# Patient Record
Sex: Female | Born: 1970
Health system: Southern US, Community
[De-identification: ages and names within clinical notes are randomized; demographics above are authoritative.]

## PROBLEM LIST (undated history)

## (undated) DIAGNOSIS — J9383 Other pneumothorax: Secondary | ICD-10-CM

## (undated) DIAGNOSIS — D239 Other benign neoplasm of skin, unspecified: Secondary | ICD-10-CM

## (undated) HISTORY — PX: NO PAST SURGERIES: SHX2092

## (undated) HISTORY — DX: Other pneumothorax: J93.83

## (undated) HISTORY — DX: Other benign neoplasm of skin, unspecified: D23.9

---

## 2000-10-22 ENCOUNTER — Other Ambulatory Visit: Admission: RE | Admit: 2000-10-22 | Discharge: 2000-10-22 | Payer: Self-pay | Admitting: Obstetrics and Gynecology

## 2001-06-04 ENCOUNTER — Ambulatory Visit (HOSPITAL_COMMUNITY): Admission: RE | Admit: 2001-06-04 | Discharge: 2001-06-04 | Payer: Self-pay | Admitting: Obstetrics and Gynecology

## 2001-06-04 ENCOUNTER — Encounter (INDEPENDENT_AMBULATORY_CARE_PROVIDER_SITE_OTHER): Payer: Self-pay

## 2001-11-11 ENCOUNTER — Other Ambulatory Visit: Admission: RE | Admit: 2001-11-11 | Discharge: 2001-11-11 | Payer: Self-pay | Admitting: Obstetrics and Gynecology

## 2002-06-04 ENCOUNTER — Inpatient Hospital Stay (HOSPITAL_COMMUNITY): Admission: AD | Admit: 2002-06-04 | Discharge: 2002-06-04 | Payer: Self-pay | Admitting: Obstetrics and Gynecology

## 2002-06-05 ENCOUNTER — Inpatient Hospital Stay (HOSPITAL_COMMUNITY): Admission: AD | Admit: 2002-06-05 | Discharge: 2002-06-07 | Payer: Self-pay | Admitting: Obstetrics and Gynecology

## 2002-07-12 ENCOUNTER — Other Ambulatory Visit: Admission: RE | Admit: 2002-07-12 | Discharge: 2002-07-12 | Payer: Self-pay | Admitting: Obstetrics and Gynecology

## 2003-07-14 ENCOUNTER — Other Ambulatory Visit: Admission: RE | Admit: 2003-07-14 | Discharge: 2003-07-14 | Payer: Self-pay | Admitting: Obstetrics and Gynecology

## 2004-10-11 ENCOUNTER — Other Ambulatory Visit: Admission: RE | Admit: 2004-10-11 | Discharge: 2004-10-11 | Payer: Self-pay | Admitting: Obstetrics and Gynecology

## 2005-04-04 ENCOUNTER — Emergency Department (HOSPITAL_COMMUNITY): Admission: EM | Admit: 2005-04-04 | Discharge: 2005-04-04 | Payer: Self-pay | Admitting: Family Medicine

## 2005-06-14 ENCOUNTER — Emergency Department (HOSPITAL_COMMUNITY): Admission: EM | Admit: 2005-06-14 | Discharge: 2005-06-14 | Payer: Self-pay | Admitting: Family Medicine

## 2007-01-14 ENCOUNTER — Emergency Department (HOSPITAL_COMMUNITY): Admission: EM | Admit: 2007-01-14 | Discharge: 2007-01-14 | Payer: Self-pay | Admitting: Emergency Medicine

## 2008-01-13 ENCOUNTER — Ambulatory Visit: Payer: Self-pay | Admitting: Internal Medicine

## 2008-01-13 LAB — CONVERTED CEMR LAB
Albumin: 4.1 g/dL (ref 3.5–5.2)
Bacteria, UA: NEGATIVE
Bilirubin, Direct: 0.2 mg/dL (ref 0.0–0.3)
CO2: 28 meq/L (ref 19–32)
Chloride: 104 meq/L (ref 96–112)
Cholesterol: 140 mg/dL (ref 0–200)
Crystals: NEGATIVE
Eosinophils Absolute: 0.1 10*3/uL (ref 0.0–0.7)
GFR calc Af Amer: 105 mL/min
GFR calc non Af Amer: 87 mL/min
HDL: 47.6 mg/dL (ref >39.0)
Ketones, ur: NEGATIVE mg/dL
LDL Cholesterol: 85 mg/dL (ref 0–99)
Lymphocytes Relative: 27.7 % (ref 12.0–46.0)
Monocytes Absolute: 0.3 10*3/uL (ref 0.1–1.0)
Neutrophils Relative %: 62.8 % (ref 43.0–77.0)
Platelets: 178 10*3/uL (ref 150–400)
Potassium: 4.1 meq/L (ref 3.5–5.1)
Sodium: 139 meq/L (ref 135–145)
Specific Gravity, Urine: 1.025 (ref 1.000–1.03)
TSH: 0.68 microintl units/mL (ref 0.35–5.50)
Total CHOL/HDL Ratio: 2.9
Total Protein, Urine: NEGATIVE mg/dL
Triglycerides: 36 mg/dL (ref 0–149)
VLDL: 7 mg/dL (ref 0–40)
WBC: 4.4 10*3/uL — ABNORMAL LOW (ref 4.5–10.5)
pH: 5 (ref 5.0–8.0)

## 2008-01-17 ENCOUNTER — Ambulatory Visit: Payer: Self-pay | Admitting: Internal Medicine

## 2008-12-15 ENCOUNTER — Ambulatory Visit: Payer: Self-pay | Admitting: Internal Medicine

## 2008-12-15 DIAGNOSIS — R300 Dysuria: Secondary | ICD-10-CM | POA: Insufficient documentation

## 2008-12-15 DIAGNOSIS — N390 Urinary tract infection, site not specified: Secondary | ICD-10-CM | POA: Insufficient documentation

## 2008-12-15 LAB — CONVERTED CEMR LAB
Bilirubin Urine: NEGATIVE
Protein, U semiquant: NEGATIVE

## 2008-12-29 ENCOUNTER — Ambulatory Visit: Payer: Self-pay | Admitting: Internal Medicine

## 2009-05-07 ENCOUNTER — Ambulatory Visit: Payer: Self-pay | Admitting: Family Medicine

## 2009-05-09 ENCOUNTER — Telehealth: Payer: Self-pay | Admitting: Family Medicine

## 2010-03-21 ENCOUNTER — Ambulatory Visit
Admission: RE | Admit: 2010-03-21 | Discharge: 2010-03-21 | Payer: Self-pay | Source: Home / Self Care | Attending: Family Medicine | Admitting: Family Medicine

## 2010-03-21 DIAGNOSIS — G43109 Migraine with aura, not intractable, without status migrainosus: Secondary | ICD-10-CM | POA: Insufficient documentation

## 2010-03-29 ENCOUNTER — Ambulatory Visit
Admission: RE | Admit: 2010-03-29 | Discharge: 2010-03-29 | Payer: Self-pay | Source: Home / Self Care | Attending: Family Medicine | Admitting: Family Medicine

## 2010-03-29 ENCOUNTER — Other Ambulatory Visit: Payer: Self-pay | Admitting: Family Medicine

## 2010-04-02 ENCOUNTER — Telehealth: Payer: Self-pay | Admitting: Family Medicine

## 2010-04-02 DIAGNOSIS — I781 Nevus, non-neoplastic: Secondary | ICD-10-CM | POA: Insufficient documentation

## 2010-04-02 NOTE — Progress Notes (Signed)
Summary: return call  Phone Note Call from Patient Call back at Home Phone [phone]   Caller: Patient Call For: Roderick Pee MD Summary of Call: calling back to let you know that yes, she has had blood in her urine in the past without infection. Initial call taken by: Raechel Ache, RN,  May 09, 2009 1:31 PM  Follow-up for Phone Call        she's had hematuria ,,,,.  First noticed after her 38-year-old child was born....Marland KitchenMarland KitchenI recommend she have a urologic evaluation.  She is disinclined to do that once again, a urine specimen to be sure it's persistent.  Advised to return one week after.  Menses for UA Follow-up by: Roderick Pee MD,  May 10, 2009 3:19 PM

## 2010-04-02 NOTE — Assessment & Plan Note (Signed)
Summary: NEW PT EST (TXFR FROM [facility]) // RS pt rsc/njr   Vital Signs:  Patient profile:   40 year old female Menstrual status:  regular LMP:     05/06/2009 Height:      73 inches Weight:      164 pounds Temp:     98.5 degrees F oral BP sitting:   112 / 78  (left arm) Cuff size:   regular  Vitals Entered By: Kern Reap CMA Duncan Dull) (May 07, 2009 2:37 PM)  Reason for Visit establish care   Primary Care Provider:  JOnes   History of Present Illness: Brittany Waters is a delightful 40 year old, married female, nonsmoker G1, P1....... husband had a vasectomy.... who works  for major benefits, company comes in today for general physical examination,  She's always been in excellent, health.  She's had no chronic health problems.  She had pneumothorax x 2.  They required chest tubes.  Subsequent had surgery.  No recurrences.  Childbirth  x 1, allergic reaction to sulfa, but not sure what kind of reaction .Marland Kitchen... ask her mom.  Review of systems negative, except she's had a history of asymptomatic hematuria and an occasional UTI.  She continues to exercise on a regular basis.  She is a runner.  She is married, has one child......4-year-old daughter  as noted above.  Family history see data last tetanus booster.2010.  She had a skin lesion  removed from her left arm by Dr. gross.  After the initial removal she had a   reexcision.  They told her it was bordering on melanoma most likely a dysplastic type nevus  friend of Milana Huntsman  Allergies: 1)  ! Sulfa  Past History:  Past medical, surgical, family and social histories (including risk factors) reviewed, and no changes noted (except as noted below).  Past Medical History: Spontaneous Pneumothorax X2, stapled on the left upper lobe dysplastic nevus of the left arm by derm.  - re-incission x 1 year recommended  Past Surgical History: Reviewed history from 01/17/2008 and no changes required. Denies surgical history  Family History: Reviewed  history from 01/17/2008 and no changes required. Family History of Alcoholism/Addiction Family History High cholesterol Family History Hypertension  Social History: Reviewed history from 12/15/2008 and no changes required. 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 College at [employer]..Masters degree in social work  Review of Systems      See HPI  Physical Exam  General:  Well-developed,well-nourished,in no acute distress; alert,appropriate and cooperative throughout examination Head:  Normocephalic and atraumatic without obvious abnormalities. No apparent alopecia or balding. Eyes:  No corneal or conjunctival inflammation noted. EOMI. Perrla. Funduscopic exam benign, without hemorrhages, exudates or papilledema. Vision grossly normal. Ears:  External ear exam shows no significant lesions or deformities.  Otoscopic examination reveals clear canals, tympanic membranes are intact bilaterally without bulging, retraction, inflammation or discharge. Hearing is grossly normal bilaterally. Nose:  External nasal examination shows no deformity or inflammation. Nasal mucosa are pink and moist without lesions or exudates. Mouth:  Oral mucosa and oropharynx without lesions or exudates.  Teeth in good repair. Neck:  No deformities, masses, or tenderness noted. Chest Wall:  No deformities, masses, or tenderness noted. Breasts:  No mass, nodules, thickening, tenderness, bulging, retraction, inflamation, nipple discharge or skin changes noted.   Lungs:  Normal respiratory effort, chest expands symmetrically. Lungs are clear to auscultation, no crackles or wheezes. Heart:  Normal rate and  regular rhythm. S1 and S2 normal without gallop, murmur, click, rub or other extra sounds. Abdomen:  Bowel sounds positive,abdomen soft and non-tender without masses, organomegaly or hernias noted. Msk:  No  deformity or scoliosis noted of thoracic or lumbar spine.   Pulses:  R and L carotid,radial,femoral,dorsalis pedis and posterior tibial pulses are full and equal bilaterally Extremities:  No clubbing, cyanosis, edema, or deformity noted with normal full range of motion of all joints.   Neurologic:  No cranial nerve deficits noted. Station and gait are normal. Plantar reflexes are down-going bilaterally. DTRs are symmetrical throughout. Sensory, motor and coordinative functions appear intact. Skin:  total body skin exam normal.  Scar, left arm from previous removal of a lesion Cervical Nodes:  No lymphadenopathy noted Axillary Nodes:  No palpable lymphadenopathy Inguinal Nodes:  No significant adenopathy Psych:  Cognition and judgment appear intact. Alert and cooperative with normal attention span and concentration. No apparent delusions, illusions, hallucinations   Impression & Recommendations:  Problem # 1:  ROUTINE GENERAL MEDICAL EXAM@[email or web address]  CARE FACL (ICD-V70.0) Assessment New  Patient Instructions: 1)  SPF sunscreen, is 50+ and checked your scan, monthly when you do your  monthly breast exam 2)  Please schedule a follow-up appointment in 1 year.

## 2010-04-04 NOTE — Assessment & Plan Note (Signed)
Summary: sudden onset of vision loss/headache and feeling ill all over/dm   Vital Signs:  Patient profile:   40 year old female Menstrual status:  regular Height:      73 inches Weight:      170 pounds BMI:     22.51 Temp:     97.5 degrees F oral BP sitting:   102 / 72  (left arm) Cuff size:   regular  Vitals Entered By: Kern Reap CMA Duncan Dull) (March 21, 2010 3:30 PM) CC: blurr vision, not feeling well, dull headache   Primary Care Provider:  JOnes  CC:  blurr vision, not feeling well, and dull headache.  History of Present Illness: Brittany Waters is a 40 year old, married female, nonsmoker, who comes in today for evaluation of blurred vision.  Since she was a teenager.  She has a "tension headache once or twice a month"..... she describes the headaches as they usually occur in the morning, when she wakes up.  She takes the Motrin, and they go away.  Sometimes it occur before her period.  Today, at 12 noon at work.  She had the gradual onset of blurred vision in both eyes.  It lasted about 30 to 45 minutes and went away.  She now has a dull headache.  Review of systems negative.  Next period due in 3 days.  No birth control.  Her husband had a vasectomy  She's also had a history of dysplastic nevus and we will also do a complete skin exam  Allergies: 1)  ! Sulfa  Past History:  Past medical, surgical, family and social histories (including risk factors) reviewed for relevance to current acute and chronic problems.  Past Medical History: Reviewed history from 05/07/2009 and no changes required. Spontaneous Pneumothorax X2, stapled on the left upper lobe dysplastic nevus of the left arm by derm.  - re-incission x 1 year recommended  Past Surgical History: Reviewed history from 01/17/2008 and no changes required. Denies surgical history  Family History: Reviewed history from 01/17/2008 and no changes required. Family History of Alcoholism/Addiction Family History High  cholesterol Family History Hypertension  Social History: Reviewed history from 05/07/2009 and no changes required. 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 College at [employer]..Masters degree in social work  Review of Systems      See HPI  Physical Exam  General:  Well-developed,well-nourished,in no acute distress; alert,appropriate and cooperative throughout examination Head:  Normocephalic and atraumatic without obvious abnormalities. No apparent alopecia or balding. Eyes:  No corneal or conjunctival inflammation noted. EOMI. Perrla. Funduscopic exam benign, without hemorrhages, exudates or papilledema. Vision grossly normal. Ears:  External ear exam shows no significant lesions or deformities.  Otoscopic examination reveals clear canals, tympanic membranes are intact bilaterally without bulging, retraction, inflammation or discharge. Hearing is grossly normal bilaterally. Nose:  External nasal examination shows no deformity or inflammation. Nasal mucosa are pink and moist without lesions or exudates. Mouth:  Oral mucosa and oropharynx without lesions or exudates.  Teeth in good repair. Neurologic:  No cranial nerve deficits noted. Station and gait are normal. Plantar reflexes are down-going bilaterally. DTRs are symmetrical throughout. Sensory, motor and coordinative functions appear intact. Skin:  total body skin exam normal except for a lesion on her back, right scapula that has two pigment senate ground with central dark advised to return next week for removal   Problems:  Medical Problems Added: 1)  Dx of Migraine With Aura  (  ICD-346.00)  Impression & Recommendations:  Problem # 1:  MIGRAINE WITH AURA (ICD-346.00) Assessment New  Her updated medication list for this problem includes:    Zomig Zmt 2.5 Mg Tbdp (Zolmitriptan) .Marland Kitchen... 1 sl stat at onset of  migraine  Complete Medication List: 1)  Zomig Zmt 2.5 Mg Tbdp (Zolmitriptan) .Marland Kitchen.. 1 sl stat at onset of migraine  Patient Instructions: 1)  continue Motrin 400 mg 3 times a day p.r.n. for migraines, and/or Zomig one sublingual stat at the onset of a headache. 2)  Return p.r.n. Prescriptions: ZOMIG ZMT 2.5 MG TBDP (ZOLMITRIPTAN) 1 SL stat At onset of migraine  #6 x 11   Entered and Authorized by:   Roderick Pee MD   Signed by:   Roderick Pee MD on 03/21/2010   Method used:   Print then Give to Patient   RxID:   [id number]    Orders Added: 1)  Est. Patient Level III [69629]

## 2010-04-10 NOTE — Progress Notes (Signed)
  Phone Note Outgoing Call   Summary of Call: I called Cerise to explain to her what dysplastic nevi are.  We discussed skin exam monthly at home in new exam here.  Sunscreens etc. Initial call taken by: Roderick Pee MD,  April 02, 2010 6:11 PM

## 2010-04-10 NOTE — Miscellaneous (Signed)
Summary: Consent ot office procedure  Consent ot office procedure   Imported By: Maryln Gottron 04/05/2010 12:28:43  _____________________________________________________________________  External Attachment:    Type:   Image     Comment:   External Document

## 2010-04-10 NOTE — Assessment & Plan Note (Signed)
Summary: mole removal//ccm   Procedure Note Last Tetanus: Tdap (12/15/2008)  Mole Biopsy/Removal: Indication: rule out cancer Consent signed: yes  Procedure # 1: elliptical incision with 2 mm margin    Size (in cm): 1.5 x 1.0    Region: posterior    Location: back-mid-right    Instrument used: #15 blade    Anesthesia: 1% lidocaine w/epinephrine    Closure: cautery  Procedure # 2: elliptical incision with 2 mm margin    Size (in cm): 1.0 x 1.0    Region: posterior    Location: back-upper-right    Instrument used: #15 blade    Anesthesia: 1% lidocaine w/epinephrine    Closure: cautery    Primary Care Provider:  JOnes   History of Present Illness: Brittany Waters is a 40 year old, married female, who has a history of white skin light.  Eyes, and previous mole removal, which was dysplastic and comes in today for removal of 2 moles  Allergies: 1)  ! Sulfa   Complete Medication List: 1)  Zomig Zmt 2.5 Mg Tbdp (Zolmitriptan) .Marland Kitchen.. 1 sl stat at onset of migraine  Other Orders: Shave Skin Lesion 1.1-2.0 cm/trunk/arm/leg (09811)   Orders Added: 1)  Shave Skin Lesion 1.1-2.0 cm/trunk/arm/leg [[id number]]

## 2010-07-19 NOTE — Op Note (Signed)
[facility]  Patient:    Brittany Waters, Brittany Waters Visit Number: [id number] MRN: [id number]          Service Type: DSU Location: [facility] Attending Physician:  Rhina Brackett Dictated by:   Duke Salvia. Marcelle Overlie, M.D. Proc. Date: 06/04/01 Admit Date:  06/04/2001 Discharge Date: 06/04/2001                             Operative Report  PREOPERATIVE DIAGNOSIS:       Missed AB.  POSTOPERATIVE DIAGNOSIS:      Missed AB.  PROCEDURE:                    D&E.  SURGEON:                      Duke Salvia. Marcelle Overlie, M.D.  ANESTHESIA:                   Sedation with paracervical block.  PROCEDURE & FINDINGS:                     The patient taken to operating room and after adequate level of sedation was obtained with the patients legs in stirrups, the perineum and vagina were prepped and draped with Betadine.  The bladder was drained.  Examination under anesthesia was carried out and the uterus was 9-10 week size, mid position, adnexa negative.  Speculum was positioned.  Paracervical block was created by infiltrating at 3 oclock and 9 oclock submucosally. Then 5-7 cc of 1% Xylocaine on either side after aspiration.  The cervix was grasped with a tenaculum, sounded to 10 cm, progressively dilated to a #29 Pratt.  A #8 curved suction curet was then used to perform suction curettage. A moderate amount of tissue was noted, with no further tissue to be removed. A small blunt curet was used to explore the cavity, revealing it to be clean. There was minimal bleeding.  She tolerated this well.  Went to recovery room in good condition. Dictated by:   Duke Salvia. Marcelle Overlie, M.D. Attending Physician:  Rhina Brackett DD:  06/04/01 TD:  06/05/01 Job: [id number]

## 2010-07-19 NOTE — H&P (Signed)
[facility]  Patient:    Brittany, Waters Visit Number: [id number] MRN: [id number]          Service Type: DSU Location: [facility] Attending Physician:  Rhina Brackett Dictated by:   Duke Salvia. Marcelle Overlie, M.D. Admit Date:  06/04/2001 Discharge Date: 06/04/2001                           History and Physical  CHIEF COMPLAINT:              Missed AB.  HISTORY OF PRESENT ILLNESS:   A 40 year old G1 P0 presented to the office yesterday for a new OB appointment.  Ultrasound confirmed a missed AB.  Fetal pole was noted without FHR.  Presents now for D&E.  This procedure including risks of bleeding, infection, other complications that may require additional surgery reviewed with her also.  PAST MEDICAL HISTORY:  ALLERGIES:                    SULFA.  OPERATIONS:                   Pneumothorax with a chest tube placement in January 2002; otherwise negative.  PHYSICAL EXAMINATION:  VITAL SIGNS:                  Temperature 98.2, blood pressure 100/70.  HEENT:                        Unremarkable.  NECK:                         Supple without masses.  LUNGS:                        Clear.  CARDIOVASCULAR:               Regular in rate and rhythm without murmurs, rubs, or gallops noted.  BREASTS:                      Without masses.  ABDOMEN:                      Soft, flat, nontender.  PELVIC:                       Normal external genitalia, vagina and cervix clear.  Uterus eight weeks size, midposition.  Adnexa negative.  IMPRESSION:                   Eight-week intrauterine pregnancy, missed abortion.  PLAN:                         D&E.  Discussed as above.Dictated by:   Duke Salvia. Marcelle Overlie, M.D. Attending Physician:  Rhina Brackett DD:  06/03/01 TD:  06/03/01 Job: [id number]

## 2010-09-16 ENCOUNTER — Encounter: Payer: Self-pay | Admitting: Family Medicine

## 2010-09-16 ENCOUNTER — Ambulatory Visit (INDEPENDENT_AMBULATORY_CARE_PROVIDER_SITE_OTHER): Payer: Managed Care, Other (non HMO) | Admitting: Family Medicine

## 2010-09-16 VITALS — BP 102/70 | Temp 98.5°F | Wt 168.0 lb

## 2010-09-16 DIAGNOSIS — I781 Nevus, non-neoplastic: Secondary | ICD-10-CM

## 2010-09-16 DIAGNOSIS — L500 Allergic urticaria: Secondary | ICD-10-CM

## 2010-09-16 DIAGNOSIS — T50905A Adverse effect of unspecified drugs, medicaments and biological substances, initial encounter: Secondary | ICD-10-CM | POA: Insufficient documentation

## 2010-09-16 MED ORDER — PREDNISONE 20 MG PO TABS: ORAL_TABLET | ORAL | Status: DC

## 2010-09-16 NOTE — Patient Instructions (Signed)
Take the prednisone as directed.  In the future avoid all amoxicillin and penicillin derivatives.  Return p.r.n.

## 2010-09-16 NOTE — Progress Notes (Signed)
  Subjective:    Patient ID: Brittany Waters, female    DOB: [birth year], 40 y.o.   MRN: [id number]  HPI Brittany Waters is a 40 year old female, who comes in today for evaluation of acute urticaria.  She states she was in the main and on July the fourth went to a hospital because of facial pain and fever.  Was given amoxicillin with a diagnosis of sinusitis.  This past Sunday morning she woke up with acute urticaria.  She took Benadryl and Zyrtec on Sunday and came in today for evaluation.  She is previously reacted to sulfa and bubble bath.  No airway edema   Review of Systems    General and metabolic review of systems otherwise negative Objective:   Physical Exam    Well-developed well-nourished, female, in no acute distress.  Examination of the skin shows multiple areas of hives.  Because of her previous history of a dysplastic nevus on her back and her left arm, total body skin exam was done.  No abnormal lesions identified  Assessment & Plan:  Acute urticaria.  Plan prednisone burst and taper

## 2011-02-11 ENCOUNTER — Telehealth: Payer: Self-pay | Admitting: Family Medicine

## 2011-02-11 NOTE — Telephone Encounter (Signed)
Open in error

## 2011-02-13 ENCOUNTER — Ambulatory Visit: Payer: Managed Care, Other (non HMO) | Admitting: Family Medicine

## 2011-07-03 ENCOUNTER — Encounter: Payer: Self-pay | Admitting: Family Medicine

## 2011-07-03 ENCOUNTER — Ambulatory Visit (INDEPENDENT_AMBULATORY_CARE_PROVIDER_SITE_OTHER): Payer: BC Managed Care – PPO | Admitting: Family Medicine

## 2011-07-03 ENCOUNTER — Telehealth: Payer: Self-pay | Admitting: Family Medicine

## 2011-07-03 VITALS — BP 102/64

## 2011-07-03 DIAGNOSIS — R300 Dysuria: Secondary | ICD-10-CM

## 2011-07-03 LAB — POCT URINALYSIS DIPSTICK
Bilirubin, UA: NEGATIVE
Glucose, UA: NEGATIVE
Ketones, UA: NEGATIVE
Nitrite, UA: NEGATIVE
Spec Grav, UA: <=1.005
Urobilinogen, UA: 0.2
pH, UA: 6.5

## 2011-07-03 MED ORDER — CIPROFLOXACIN HCL 250 MG PO TABS: 250.0000 mg | ORAL_TABLET | Freq: Every day | ORAL | Status: AC

## 2011-07-03 NOTE — Telephone Encounter (Signed)
Pt called and has a uti. Pt req work in appt with Dr Tawanna Cooler. Pls advise.

## 2011-07-03 NOTE — Progress Notes (Signed)
  Subjective:    Patient ID: Brittany Waters, female    DOB: 1970-09-23, 41 y.o.   MRN: [id number]  HPI Brittany Waters is a 40 year old married female nonsmoker who comes in today with a two-week history of dysuria  She's had a history of asymptomatic hematuria  2 weeks ago he began having dysuria but it was mild yesterday it became severe. No fever chills or back pain. LMP 2 weeks ago normal per birth control her husband's had a vasectomy last UTI a couple years ago   Review of Systems General and urologic review of systems negati    Objective:   Physical Exam  well-developed well-nourished female in no acute distress examination the abdomen was negative urinalysis shows trace white cells and trace blood         Assessment & Plan:   UTI plan because of her allergies to penicillin and sulfa,,,,,,,,,,, will use Cipro 1 day x10 days return when necessary

## 2011-07-03 NOTE — Patient Instructions (Signed)
Take the Cipro 1 tablet daily for 10 days  Drink lots of water  Return sometime this summer for a total body skin exam because of your history of dysplastic nevi

## 2011-07-03 NOTE — Telephone Encounter (Signed)
Okay to work in

## 2011-08-11 ENCOUNTER — Ambulatory Visit (INDEPENDENT_AMBULATORY_CARE_PROVIDER_SITE_OTHER): Payer: BC Managed Care – PPO | Admitting: Family Medicine

## 2011-08-11 ENCOUNTER — Encounter: Payer: Self-pay | Admitting: Family Medicine

## 2011-08-11 ENCOUNTER — Telehealth: Payer: Self-pay | Admitting: Family Medicine

## 2011-08-11 VITALS — BP 100/60 | HR 72 | Temp 98.5°F | Wt 162.0 lb

## 2011-08-11 DIAGNOSIS — R071 Chest pain on breathing: Secondary | ICD-10-CM

## 2011-08-11 NOTE — Telephone Encounter (Signed)
Called pt - Dr.Todd took call and spoke with pt- instructed pt to come to office now to be seen

## 2011-08-11 NOTE — Progress Notes (Signed)
  Subjective:    Patient ID: Brittany Waters, female    DOB: 03-23-1970, 41 y.o.   MRN: [id number]  HPI Brittany Waters  Is a 41 year old female married nonsmoker who comes in today about chest pain  12 years ago she went to emergency room the chest pain x-ray was normal she was called the next day because the x-ray was read by the radiologist showing a pneumothorax. 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 She's done well woke up this morning with some left-sided chest pain she describes as constant dull sometimes sharp Brittany Waters she takes a deep breath twists or moves. She's not been short of breath. No fever no cough   Review of Systems General and pulmonary review of systems otherwise negative    Objective:   Physical Exam Well-developed well-nourished female in no acute distress examination of the lungs is normal       Assessment & Plan:  Chest wall pain question early pneumothorax

## 2011-08-11 NOTE — Telephone Encounter (Signed)
Pulled from Triage vmail. Pt having some lower chest and back pain. Is concerned because she has a Hx of spontaneous pneumothorax. Please call. Thanks.

## 2011-08-11 NOTE — Patient Instructions (Addendum)
Go to the main off this tomorrow for a chest x-ray  Motrin 600 mg 3 times daily with food  Return sometime this summer for an office visit for a thorough skin exam  She's had a history of dysplastic nevi offered to do a thorough skin exam she declined and stated she would come back some other time

## 2011-08-15 ENCOUNTER — Telehealth: Payer: Self-pay | Admitting: Family Medicine

## 2011-08-15 NOTE — Telephone Encounter (Signed)
We received notification that this pt did not get the ordered chest xray done.

## 2011-08-15 NOTE — Telephone Encounter (Signed)
Left message on machine for patient to return our call 

## 2011-12-02 ENCOUNTER — Encounter: Payer: Self-pay | Admitting: Family Medicine

## 2011-12-02 ENCOUNTER — Ambulatory Visit (INDEPENDENT_AMBULATORY_CARE_PROVIDER_SITE_OTHER)
Admission: RE | Admit: 2011-12-02 | Discharge: 2011-12-02 | Disposition: A | Discharge status: Home / Self Care | Payer: BC Managed Care – PPO | Source: Ambulatory Visit | Attending: Family Medicine | Admitting: Family Medicine

## 2011-12-02 ENCOUNTER — Ambulatory Visit (INDEPENDENT_AMBULATORY_CARE_PROVIDER_SITE_OTHER): Payer: BC Managed Care – PPO | Admitting: Family Medicine

## 2011-12-02 VITALS — BP 102/62 | Temp 97.9°F | Wt 162.0 lb

## 2011-12-02 DIAGNOSIS — R071 Chest pain on breathing: Secondary | ICD-10-CM

## 2011-12-02 MED ORDER — DIAZEPAM 2 MG PO TABS: ORAL_TABLET | ORAL | Status: DC

## 2011-12-02 MED ORDER — TRAMADOL HCL 50 MG PO TABS: 50.0000 mg | ORAL_TABLET | Freq: Three times a day (TID) | ORAL | Status: DC | PRN

## 2011-12-02 NOTE — Progress Notes (Signed)
  Subjective:    Patient ID: Brittany Waters, female    DOB: 1970/03/07, 41 y.o.   MRN: [id number]  HPI Brittany Waters is a 41 year old married female nonsmoker who comes in today for evaluation of left thoracic back pain for 8 days  She states 8 days ago on a Sunday she woke up and experienced some low back pain. She points to the left thoracic area just below the scapula. It's been very mild and not really been bothering her all that much until yesterday when after she ran she stretched and yawned and had the sudden onset of severe pain. It's a 3 to a 9 is sharp and it radiates around the left thoracic area. She's had 2 pneumothoraxes in the past. After her last pneumo she had a decortication. No shortness of breath however the pain is consistent with the pain she's had previously with the pneumothorax.    Review of Systems General and pulmonary review of systems otherwise negative    Objective:   Physical Exam Well-developed well-nourished female no acute distress cardiac exam normal spine normal to palpation there is tenderness to palpation in the left lateral thoracic muscles just below the scapula. Lungs are clear to auscultation cardiac exam normal       Assessment & Plan:

## 2011-12-02 NOTE — Patient Instructions (Signed)
Valium and tramadol,,,,,,,,,,,, 1/2-1 tablet of each 3 times daily as needed for back pain  Go to the main office now for a chest x-ray

## 2013-05-24 ENCOUNTER — Encounter: Payer: Self-pay | Admitting: Family Medicine

## 2013-05-24 ENCOUNTER — Ambulatory Visit (INDEPENDENT_AMBULATORY_CARE_PROVIDER_SITE_OTHER): Payer: BC Managed Care – PPO | Admitting: Family Medicine

## 2013-05-24 VITALS — BP 110/78 | Temp 97.7°F | Wt 174.0 lb

## 2013-05-24 DIAGNOSIS — R300 Dysuria: Secondary | ICD-10-CM

## 2013-05-24 DIAGNOSIS — N390 Urinary tract infection, site not specified: Secondary | ICD-10-CM

## 2013-05-24 LAB — POCT URINALYSIS DIPSTICK
Bilirubin, UA: NEGATIVE
GLUCOSE UA: NEGATIVE
KETONES UA: NEGATIVE
NITRITE UA: NEGATIVE
PH UA: 6
PROTEIN UA: NEGATIVE
Spec Grav, UA: 1.015
UROBILINOGEN UA: 0.2

## 2013-05-24 MED ORDER — CIPROFLOXACIN HCL 250 MG PO TABS: 250.0000 mg | ORAL_TABLET | Freq: Two times a day (BID) | ORAL | Status: DC

## 2013-05-24 NOTE — Progress Notes (Signed)
   Subjective:    Patient ID: Brittany Waters, female    DOB: December 09, 1970, 42 y.o.   MRN: [id number]  HPI Brittany Waters is a 43 year old married female nonsmoker who comes in today for evaluation of frequency urinary leakage and dysuria x5 days  She noticed a surgery some inability to hold her water and then a day later some dysuria. No fever LMP 3 weeks ago normal for birth control did hasn't set a vasectomy   Review of Systems    review of systems otherwise negative Objective:   Physical Exam  Well-developed well-nourished female no acute distress vital signs stable he says afebrile examination the abdomen is negative except for some tenderness over the bladder urinalysis shows white cells and bacteria      Assessment & Plan:  Cystitis,,,,,,,, she's allergic to Septra,,,,,, and penicillin drugs,,,,, both give her hives........Marland Kitchen

## 2013-05-24 NOTE — Patient Instructions (Signed)
Drink lots of water  Cipro,,,,,, one twice daily until the bottle is empty

## 2014-04-26 ENCOUNTER — Ambulatory Visit (INDEPENDENT_AMBULATORY_CARE_PROVIDER_SITE_OTHER): Payer: BLUE CROSS/BLUE SHIELD | Admitting: Family Medicine

## 2014-04-26 ENCOUNTER — Encounter: Payer: Self-pay | Admitting: Family Medicine

## 2014-04-26 VITALS — BP 110/70 | Temp 98.4°F | Wt 170.0 lb

## 2014-04-26 DIAGNOSIS — J111 Influenza due to unidentified influenza virus with other respiratory manifestations: Secondary | ICD-10-CM

## 2014-04-26 DIAGNOSIS — J1189 Influenza due to unidentified influenza virus with other manifestations: Secondary | ICD-10-CM

## 2014-04-26 MED ORDER — HYDROCODONE-HOMATROPINE 5-1.5 MG/5ML PO SYRP: 5.0000 mL | ORAL_SOLUTION | Freq: Three times a day (TID) | ORAL | Status: DC | PRN

## 2014-04-26 NOTE — Progress Notes (Signed)
   Subjective:    Patient ID: Brittany Waters, female    DOB: [birth year], 44 y.o.   MRN: [id number]  HPI Brittany Waters is a 44 year old married female nonsmoker who comes in today with a history of fever chills aching all over sore throat and cough for 24 hours  Her husband has similar symptoms and went to a doctor and got Tamiflu   Review of Systems Review of systems otherwise negative    Objective:   Physical Exam  Well-developed well-nourished female no acute distress vital signs stable he is afebrile HEENT were negative neck was supple no adenopathy lungs are clear      Assessment & Plan:  Influenza......... treat symptomatically with liquids Tylenol and Hydromet. Tamiflu not indicated.Marland KitchenMarland KitchenMarland Kitchen

## 2014-04-26 NOTE — Patient Instructions (Signed)
Drink lots of liquids  Tylenol or aspirin for fever chills  Hydromet...Marland KitchenMarland KitchenMarland Kitchen 1/2-1 teaspoon 3 times daily when necessary for cough and cold  Return when necessary

## 2014-04-26 NOTE — Progress Notes (Signed)
Pre visit review using our clinic review tool, if applicable. No additional management support is needed unless otherwise documented below in the visit note. 

## 2014-08-02 ENCOUNTER — Other Ambulatory Visit: Payer: Self-pay | Admitting: Obstetrics and Gynecology

## 2014-08-04 LAB — CYTOLOGY - PAP

## 2014-11-21 ENCOUNTER — Ambulatory Visit (INDEPENDENT_AMBULATORY_CARE_PROVIDER_SITE_OTHER)
Admission: RE | Admit: 2014-11-21 | Discharge: 2014-11-21 | Disposition: A | Discharge status: Home / Self Care | Payer: BLUE CROSS/BLUE SHIELD | Source: Ambulatory Visit | Attending: Family Medicine | Admitting: Family Medicine

## 2014-11-21 ENCOUNTER — Encounter: Payer: Self-pay | Admitting: Family Medicine

## 2014-11-21 ENCOUNTER — Ambulatory Visit (INDEPENDENT_AMBULATORY_CARE_PROVIDER_SITE_OTHER): Payer: BLUE CROSS/BLUE SHIELD | Admitting: Family Medicine

## 2014-11-21 VITALS — BP 120/80 | Temp 98.0°F | Wt 175.0 lb

## 2014-11-21 DIAGNOSIS — S62606A Fracture of unspecified phalanx of right little finger, initial encounter for closed fracture: Secondary | ICD-10-CM

## 2014-11-21 NOTE — Patient Instructions (Signed)
Go to the main office now for your x-rays  I will call you the report as soon as I get

## 2014-11-21 NOTE — Progress Notes (Signed)
Pre visit review using our clinic review tool, if applicable. No additional management support is needed unless otherwise documented below in the visit note. 

## 2014-11-21 NOTE — Progress Notes (Signed)
   Subjective:    Patient ID: Brittany Waters, female    DOB: 1970-05-11, 44 y.o.   MRN: [id number]  HPI Brittany Waters is a 44 year old married female nonsmoker who comes in today for evaluation of pain in her right fifth finger since June  She says she was playing flag football injured that finger felt it would get better but it is not. The proximal joint of that finger is still swollen and tender.   Review of Systems    review of systems otherwise negative Objective:   Physical Exam  Well-developed well-nourished female no acute distress vital signs stable she's afebrile examination of the hand shows the proximal joint on the fifth finger on the right hand is markedly swollen. Ligaments are intact no laxity      Assessment & Plan:  Probable fracture proximal joint right fifth finger...Marland KitchenMarland KitchenMarland Kitchen x-ray follow-up depending on what the x-ray shows

## 2015-01-08 ENCOUNTER — Ambulatory Visit (INDEPENDENT_AMBULATORY_CARE_PROVIDER_SITE_OTHER): Payer: BLUE CROSS/BLUE SHIELD | Admitting: Adult Health

## 2015-01-08 ENCOUNTER — Encounter: Payer: Self-pay | Admitting: Adult Health

## 2015-01-08 VITALS — BP 120/80 | HR 75 | Temp 98.2°F | Wt 175.6 lb

## 2015-01-08 DIAGNOSIS — R300 Dysuria: Secondary | ICD-10-CM | POA: Diagnosis not present

## 2015-01-08 LAB — POCT URINALYSIS DIPSTICK
Bilirubin, UA: NEGATIVE
Blood, UA: NEGATIVE
Glucose, UA: NEGATIVE
KETONES UA: NEGATIVE
Leukocytes, UA: NEGATIVE
Nitrite, UA: NEGATIVE
PH UA: 5.5
PROTEIN UA: NEGATIVE
SPEC GRAV UA: 1.025
Urobilinogen, UA: 0.2

## 2015-01-08 NOTE — Progress Notes (Signed)
Pre visit review using our clinic review tool, if applicable. No additional management support is needed unless otherwise documented below in the visit note. 

## 2015-01-08 NOTE — Progress Notes (Signed)
Subjective:    Patient ID: Brittany Waters, female    DOB: [birth year], 44 y.o.   MRN: [id number]  HPI  44 year old female who presents to the office today for UTI type symptoms. Her complaints started on Wednesday of last week. She endorses that she has burning, frequency and fatigue.   Her last UTI was about a year ago.   She denies any new soaps or detergents. She denies drinking a lot of soda or eating spicy foods.   She denies any any fevers, chills, vaginal pain or discharge.   She has not used any OTC medications Review of Systems  Constitutional: Positive for fatigue. Negative for fever and chills.  Genitourinary: Positive for dysuria, urgency, frequency and difficulty urinating. Negative for hematuria, flank pain, vaginal discharge, vaginal pain and pelvic pain.  Musculoskeletal: Negative for myalgias.  Neurological: Negative.   All other systems reviewed and are negative.  Past Medical History  Diagnosis Date  . Spontaneous pneumothorax     stapled on the left upper lobe  . Dysplastic nevus     of left arm    Social History   Social History  . Marital Status: Married    Spouse Name: N/A  . Number of Children: N/A  . Years of Education: N/A   Occupational History  . Not on file.   Social History Main Topics  . Smoking status: Unknown If Ever Smoked  . Smokeless tobacco: Not on file  . Alcohol Use: No  . Drug Use: No  . Sexual Activity: Not on file   Other Topics Concern  . Not on file   Social History Narrative    No past surgical history on file.  Family History  Problem Relation Age of Onset  . Alcohol abuse      fhx  . Hyperlipidemia      fhx  . Hypertension      fhx    Allergies  Allergen Reactions  . Penicillins Hives  . Sulfonamide Derivatives   . Sulfur Hives    No current outpatient prescriptions on file prior to visit.   No current facility-administered medications on file prior to visit.    BP 120/80 mmHg  Pulse 75   Temp(Src) 98.2 F (36.8 C) (Oral)  Wt 175 lb 9.6 oz (79.652 kg)       Objective:   Physical Exam  Constitutional: She is oriented to person, place, and time. She appears well-developed and well-nourished. No distress.  Cardiovascular: Normal rate, normal heart sounds and intact distal pulses.  Exam reveals no gallop and no friction rub.   No murmur heard. Pulmonary/Chest: Effort normal and breath sounds normal. No respiratory distress. She has no wheezes. She has no rales. She exhibits no tenderness.  Abdominal: Soft. Bowel sounds are normal. She exhibits no distension and no mass. There is no tenderness. There is no rebound and no guarding.  Musculoskeletal:  No CVA tenderness  Neurological: She is alert and oriented to person, place, and time.  Skin: Skin is warm and dry. No rash noted. She is not diaphoretic. No erythema. No pallor.  Psychiatric: She has a normal mood and affect. Her behavior is normal. Judgment and thought content normal.  Vitals reviewed.      Assessment & Plan:  1. Dysuria - Doubtful to be IC, possibly vaginitis. Doubt Hydronephrosis - POCT urinalysis dipstick- Negative for infection - Culture, Urine - She will try OTC Azo and follow up if no improvement in 2-3  days.

## 2015-01-08 NOTE — Patient Instructions (Signed)
Your urinalysis showed no infection. I will send it in for a culture to make sure.   In the meantime, you can take over the counter Azo to help with the bladder spasms and painful urination.   Follow up if no improvement in the next 2-3 days after starting Azo.

## 2015-01-10 LAB — URINE CULTURE: Colony Count: 8000

## 2015-11-23 ENCOUNTER — Encounter: Payer: Self-pay | Admitting: Family Medicine

## 2015-11-23 ENCOUNTER — Ambulatory Visit (INDEPENDENT_AMBULATORY_CARE_PROVIDER_SITE_OTHER): Payer: BLUE CROSS/BLUE SHIELD | Admitting: Family Medicine

## 2015-11-23 VITALS — BP 98/70 | HR 56 | Resp 12 | Ht 73.0 in | Wt 174.1 lb

## 2015-11-23 DIAGNOSIS — R5381 Other malaise: Secondary | ICD-10-CM

## 2015-11-23 DIAGNOSIS — R51 Headache: Secondary | ICD-10-CM | POA: Diagnosis not present

## 2015-11-23 DIAGNOSIS — M542 Cervicalgia: Secondary | ICD-10-CM

## 2015-11-23 DIAGNOSIS — R5383 Other fatigue: Secondary | ICD-10-CM

## 2015-11-23 DIAGNOSIS — R519 Headache, unspecified: Secondary | ICD-10-CM

## 2015-11-23 LAB — BASIC METABOLIC PANEL
BUN: 16 mg/dL (ref 6–23)
CO2: 28 mEq/L (ref 19–32)
Calcium: 9.1 mg/dL (ref 8.4–10.5)
Chloride: 106 mEq/L (ref 96–112)
Creatinine, Ser: 0.88 mg/dL (ref 0.40–1.20)
GFR: 74.51 mL/min (ref >60.00)
Glucose, Bld: 81 mg/dL (ref 70–99)
POTASSIUM: 4.3 meq/L (ref 3.5–5.1)
SODIUM: 140 meq/L (ref 135–145)

## 2015-11-23 LAB — CBC
HCT: 40.2 % (ref 36.0–46.0)
Hemoglobin: 13.6 g/dL (ref 12.0–15.0)
MCHC: 33.8 g/dL (ref 30.0–36.0)
MCV: 90.3 fl (ref 78.0–100.0)
PLATELETS: 170 10*3/uL (ref 150.0–400.0)
RBC: 4.45 Mil/uL (ref 3.87–5.11)
RDW: 13.1 % (ref 11.5–15.5)
WBC: 4.5 10*3/uL (ref 4.0–10.5)

## 2015-11-23 LAB — TSH: TSH: 0.43 u[IU]/mL (ref 0.35–4.50)

## 2015-11-23 MED ORDER — BACLOFEN 10 MG PO TABS: 5.0000 mg | ORAL_TABLET | Freq: Every day | ORAL | 0 refills | Status: AC

## 2015-11-23 NOTE — Progress Notes (Signed)
Pre visit review using our clinic review tool, if applicable. No additional management support is needed unless otherwise documented below in the visit note. 

## 2015-11-23 NOTE — Patient Instructions (Addendum)
  [name] I have seen you today for an acute visit because your primary care provider was not available.   1. Frontal headache   2. Malaise and fatigue  - Basic Metabolic Panel - TSH - CBC - baclofen (LIORESAL) 10 MG tablet; Take 0.5 tablets (5 mg total) by mouth at bedtime.  Dispense: 30 each; Refill: 0  3. Cervicalgia  - baclofen (LIORESAL) 10 MG tablet; Take 0.5 tablets (5 mg total) by mouth at bedtime.  Dispense: 30 each; Refill: 0  Today examination does not suggest a serious illness, tension headache is a possible cause. Baclofen might help   Monitor for signs of worsening symptoms and seek immediate medical attention if any concerning/warning symptom as we discussed.   If symptoms are not resolved in 1-2 weeks you should schedule a follow up appointment with your doctor, before if symptoms get worse.  Please be sure you have an appointment already scheduled with your PCP.

## 2015-11-23 NOTE — Progress Notes (Signed)
HPI:  ACUTE VISIT:  Chief Complaint  Patient presents with  . Headache    started last monday, strong headache for days, no energy.    Brittany Waters is a 45 y.o. female, who is here today complaining of persistent left frontal headache.  She has history of migraines, usually twice per year and related to "hormonal" changes, they area usually associated with nausea and occasional visual aura precedes headache.  On 11/12/2015 she started with left frontal dull headache, intermittently, for the past 3 days he has been constant and one time 3 days ago she felt "strong" headache on same area, sharp, upon getting up rapidly.  Also she is reporting hurting "all over" since headache started as well as fatigue and cervical "soreness." No Hx of recent trauma or unusual physical activity, no limitation of ROM, no joint edema or erythema.  She denies any change in appetite or sleep pattern, she is sleeping well. Denies any history of anxiety or depression.   She denies any associated visual changes, photophobia, nausea, or vomiting.  She has not noted any rash or skin changes, fever, chills, numbness or tingling, or focal deficit. She denies any unusual stress. Pain is 4/10, "annoyed pain."  Yesterday she took Ibuprofen, which helped some but she does not like to take medications. She also took Sudafed yesterday thinking headache could be related to congestion but she has not noted respiratory symptoms. No sick contact or recent travel. She has not been swimming in lakes, public pools, or rivers.  LMP 11/12/15. Husband has vasectomy.   Review of Systems  Constitutional: Positive for fatigue. Negative for appetite change, fever and unexpected weight change.  HENT: Negative for congestion, mouth sores, nosebleeds, sore throat and trouble swallowing.   Eyes: Negative for photophobia, redness and visual disturbance.  Respiratory: Negative for cough, shortness of breath and  wheezing.   Cardiovascular: Negative for chest pain, palpitations and leg swelling.  Gastrointestinal: Negative for abdominal pain, nausea and vomiting.       Negative for changes in bowel habits.  Endocrine: Negative for cold intolerance, heat intolerance, polydipsia, polyphagia and polyuria.  Genitourinary: Negative for decreased urine volume, difficulty urinating, dysuria, hematuria and vaginal bleeding.  Musculoskeletal: Positive for myalgias and neck pain. Negative for arthralgias and gait problem.  Skin: Negative for color change, rash and wound.  Neurological: Positive for headaches. Negative for tremors, seizures, syncope, weakness and numbness.  Hematological: Negative for adenopathy. Does not bruise/bleed easily.  Psychiatric/Behavioral: Negative for confusion and sleep disturbance. The patient is not nervous/anxious.       No current outpatient prescriptions on file prior to visit.   No current facility-administered medications on file prior to visit.      Past Medical History:  Diagnosis Date  . Dysplastic nevus    of left arm  . Spontaneous pneumothorax    stapled on the left upper lobe   Allergies  Allergen Reactions  . Penicillins Hives  . Sulfonamide Derivatives   . Sulfur Hives    Social History   Social History  . Marital status: Married    Spouse name: N/A  . Number of children: N/A  . Years of education: N/A   Social History Main Topics  . Smoking status: Unknown If Ever Smoked  . Smokeless tobacco: None  . Alcohol use No  . Drug use: No  . Sexual activity: Not Asked   Other Topics Concern  . None   Social History Narrative  .  None    Vitals:   11/23/15 1118  BP: 98/70  Pulse: (!) 56  Resp: 12   O2 sat at RA 98%.  Body mass index is 22.97 kg/m.    Physical Exam  Nursing note and vitals reviewed. Constitutional: She is oriented to person, place, and time. She appears well-developed and well-nourished. She does not appear ill.  No distress.  HENT:  Head: Atraumatic.  Mouth/Throat: Oropharynx is clear and moist. Mucous membranes are dry.  Eyes: Conjunctivae and EOM are normal. Pupils are equal, round, and reactive to light.  Fundoscopic exam:      The right eye shows no hemorrhage and no papilledema.       The left eye shows no hemorrhage and no papilledema.  Neck: Normal range of motion. No neck rigidity. No Brudzinski's sign and no Kernig's sign noted. No thyroid mass and no thyromegaly present.  Cardiovascular: Regular rhythm.  Bradycardia present.   No murmur heard. Pulses:      Dorsalis pedis pulses are 2+ on the right side, and 2+ on the left side.  Reporting Hx of bradycardia  Respiratory: Effort normal and breath sounds normal. No respiratory distress.  GI: Soft. She exhibits no mass. There is no hepatomegaly. There is no tenderness.  Musculoskeletal: She exhibits no edema or tenderness.       Cervical back: She exhibits normal range of motion and no tenderness.  Left trapezium muscle spasm. No tenderness upon palpation of paraspinal cervical and thoracic muscles.  Lymphadenopathy:    She has no cervical adenopathy.  Neurological: She is alert and oriented to person, place, and time. She has normal strength. No cranial nerve deficit or sensory deficit. Coordination and gait normal.  Skin: Skin is warm. No rash noted. No erythema.  Psychiatric: She has a normal mood and affect.  Well groomed, good eye contact.      ASSESSMENT AND PLAN:     Ariatna was seen today for headache.  Diagnoses and all orders for this visit:  Malaise and fatigue  We discussed possible causes, because she is reporting this problem as a new onset lab work was recommended today. For now we will monitor and further recommendations would be given according to lab results.  -     Basic Metabolic Panel -     TSH -     CBC -     baclofen (LIORESAL) 10 MG tablet; Take 0.5 tablets (5 mg total) by mouth at  bedtime.  Frontal headache  Explained that headache is a symptom that can be related to multiple illnesses, this particular headache does not seem to be one of her migraines and physical examination does not suggest a concerning etiology. This could be a tension headache, baclofen at the time mayt help, some side effects discussed. We also discussed the option of having brain imaging done but she prefers to hold on this options for now. She was clearly instructed about warning signs. Follow-up in 1-2 weeks if headache is persistent, before if it is worse.   Cervicalgia  Baclofen 5-10 mg at bedtime, ROM exercises, massage, and local heat may help. F/U as needed.  -     baclofen (LIORESAL) 10 MG tablet; Take 0.5 tablets (5 mg total) by mouth at bedtime.    Return if symptoms worsen or fail to improve.     -Ms.Naveen Lorusso Holstine was advised to return or notify a doctor immediately if symptoms worsen or persist or new concerns arise, she voices understanding.  Rusty Glodowski G. [place], MD  [facility]. [place] office.

## 2016-04-23 ENCOUNTER — Telehealth: Payer: Self-pay | Admitting: Family Medicine

## 2016-04-23 DIAGNOSIS — R002 Palpitations: Secondary | ICD-10-CM | POA: Diagnosis not present

## 2016-04-23 NOTE — Telephone Encounter (Signed)
[place] [facility]  Patient Name: Brittany Waters  DOB: [birth year]    Initial Comment Caller says all day her heart beats really hard then she feels like she's out of breath.    Nurse Assessment  Nurse: Harlow Mares, RN, Suanne Marker Date/Time Eilene Ghazi Time): 04/23/2016 4:48:45 PM  Confirm and document reason for call. If symptomatic, describe symptoms. ---Caller says all day her heart beats really hard then she feels like she's out of breath.  Does the patient have any new or worsening symptoms? ---Yes  Will a triage be completed? ---Yes  Related visit to physician within the last 2 weeks? ---N/A  Does the PT have any chronic conditions? (i.e. diabetes, asthma, etc.) ---No  Is the patient pregnant or possibly pregnant? (Ask all females between the ages of 17-55) ---No  Is this a behavioral health or substance abuse call? ---No     Guidelines    Guideline Title Affirmed Question Affirmed Notes  Heart Rate and Heartbeat Questions [1] Skipped or extra beat(s) AND [2] occurs 4 or more times per minute    Final Disposition User   See Physician within 4 Hours (or PCP triage) Harlow Mares, RN, Rhonda    Referrals  GO TO FACILITY UNDECIDED   Disagree/Comply: Comply

## 2016-04-24 NOTE — Telephone Encounter (Signed)
Spoke with pt and she states that she was seen at [facility] last night. They did an EKG and labs. She was advised that EKG was normal, but labs are still pending. Asked pt to please have them send [place] records to keep in her chart. They also advised her they would request a cardiology referral. She is waiting to hear about that. Advised pt if symptoms worsen she needs to seek care immediately. Pt voiced understanding to all. Nothing further needed at this time.

## 2016-04-24 NOTE — Telephone Encounter (Signed)
LMTCB

## 2016-04-29 ENCOUNTER — Telehealth: Payer: Self-pay | Admitting: Cardiology

## 2016-04-29 NOTE — Telephone Encounter (Signed)
close encounter

## 2016-05-06 ENCOUNTER — Encounter: Payer: Self-pay | Admitting: Cardiology

## 2016-05-06 ENCOUNTER — Ambulatory Visit (INDEPENDENT_AMBULATORY_CARE_PROVIDER_SITE_OTHER): Payer: BLUE CROSS/BLUE SHIELD | Admitting: Cardiology

## 2016-05-06 ENCOUNTER — Encounter (INDEPENDENT_AMBULATORY_CARE_PROVIDER_SITE_OTHER): Payer: Self-pay

## 2016-05-06 VITALS — BP 110/78 | HR 66 | Ht 73.0 in | Wt 178.0 lb

## 2016-05-06 DIAGNOSIS — R002 Palpitations: Secondary | ICD-10-CM | POA: Diagnosis not present

## 2016-05-06 NOTE — Telephone Encounter (Signed)
Closed encounter °

## 2016-05-06 NOTE — Progress Notes (Signed)
Electrophysiology Office Note   Date:  05/06/2016   ID:  Brittany Waters, DOB 1971/01/05, MRN [id number]  PCP:  Joycelyn Man, MD  Primary Electrophysiologist:  Constance Haw, MD    Chief Complaint  Patient presents with  . New Patient (Initial Visit)    SOB/Fluttering     History of Present Illness: Brittany Waters is a 46 y.o. female who presents today for electrophysiology evaluation.   She presents today for evaluation of palpitations. Is also associated with shortness of breath. Her palpitations are been going on since February. Palpitations make it difficult for her to catch her breath, but she does not feel short of breath. She has no dizziness, nausea, or vomiting. Her EKG at her urgent care showed sinus rhythm, rate 50. She says that she has episodic palpitations a few times in our. She says they last for seconds at a time. They have been going on for the last 2 weeks.  Today, she denies symptoms of chest pain, shortness of breath, orthopnea, PND, lower extremity edema, claudication, dizziness, presyncope, syncope, bleeding, or neurologic sequela. The patient is tolerating medications without difficulties and is otherwise without complaint today.    Past Medical History:  Diagnosis Date  . Dysplastic nevus    of left arm  . Spontaneous pneumothorax    stapled on the left upper lobe   Past Surgical History:  Procedure Laterality Date  . NO PAST SURGERIES       Current Outpatient Prescriptions  Medication Sig Dispense Refill  . aspirin 81 MG tablet Take 81 mg by mouth daily.     No current facility-administered medications for this visit.     Allergies:   Penicillins; Sulfonamide derivatives; and Sulfur   Social History:  The patient  reports that she has never smoked. She has never used smokeless tobacco. She reports that she does not drink alcohol or use drugs.   Family History:  The patient's family history includes Atrial fibrillation in her father,  maternal grandmother, and mother; Colon cancer in her maternal grandfather; Heart attack in her paternal grandmother; Hypertension in her father; Stroke in her maternal grandmother.    ROS:  Please see the history of present illness.   Otherwise, review of systems is positive for palpitations.   All other systems are reviewed and negative.    PHYSICAL EXAM: VS:  BP 110/78   Pulse 66   Ht 6' 1"  (1.854 m)   Wt 178 lb (80.7 kg)   BMI 23.48 kg/m  , BMI Body mass index is 23.48 kg/m. GEN: Well nourished, well developed, in no acute distress  HEENT: normal  Neck: no JVD, carotid bruits, or masses Cardiac: RRR; no murmurs, rubs, or gallops,no edema  Respiratory:  clear to auscultation bilaterally, normal work of breathing GI: soft, nontender, nondistended, + BS MS: no deformity or atrophy  Skin: warm and dry Neuro:  Strength and sensation are intact Psych: euthymic mood, full affect  EKG:  EKG is ordered today. Personal review of the ekg ordered shows sinus rhythm, rate 66, PVC  Recent Labs: 11/23/2015: BUN 16; Creatinine, Ser 0.88; Hemoglobin 13.6; Platelets 170.0; Potassium 4.3; Sodium 140; TSH 0.43    Lipid Panel     Component Value Date/Time   CHOL 140 01/13/2008 1017   TRIG 36 01/13/2008 1017   HDL 47.6 01/13/2008 1017   CHOLHDL 2.9 CALC 01/13/2008 1017   VLDL 7 01/13/2008 1017   LDLCALC 85 01/13/2008 1017  Wt Readings from Last 3 Encounters:  05/06/16 178 lb (80.7 kg)  11/23/15 174 lb 2 oz (79 kg)  01/08/15 175 lb 9.6 oz (79.7 kg)      Other studies Reviewed: Additional studies/ records that were reviewed today include: Urgent care notes   ASSESSMENT AND PLAN:  1.  Palpitations: Currently the cause for palpitations is unclear. Her EKGs when she was at urgent care showed normal rhythm without any major abnormalities. She has had one PVC on the EKG done today. It is possible that her symptoms are all due to PVCs. Tadeo Besecker order a 48 hour monitor to quantify the  number of PVCs, as well as look for any other sources of arrhythmia.    Current medicines are reviewed at length with the patient today.   The patient does not have concerns regarding her medicines.  The following changes were made today:  none  Labs/ tests ordered today include:  Orders Placed This Encounter  Procedures  . Holter monitor - 48 hour  . EKG 12-Lead     Disposition:   FU with Vyncent Overby 1 month  Signed, Jayveon Convey Meredith Leeds, MD  05/06/2016 2:34 PM     [facility] [place] [address] [place] [address] [phone] (office) [phone] (fax)

## 2016-05-06 NOTE — Patient Instructions (Signed)
Medication Instructions:    Your physician recommends that you continue on your current medications as directed. Please refer to the Current Medication list given to you today.  --- If you need a refill on your cardiac medications before your next appointment, please call your pharmacy. ---  Labwork:  None ordered  Testing/Procedures: Your physician has recommended that you wear a 48 hour holter monitor. Holter monitors are medical devices that record the heart's electrical activity. Doctors most often use these monitors to diagnose arrhythmias. Arrhythmias are problems with the speed or rhythm of the heartbeat. The monitor is a small, portable device. You can wear one while you do your normal daily activities. This is usually used to diagnose what is causing palpitations/syncope (passing out).  Follow-Up:  Your physician recommends that you schedule a follow-up appointment in: 1 month with Dr. Curt Bears.  Thank you for choosing [facility]!!   Trinidad Curet, RN [phone]   Any Other Special Instructions Will Be Listed Below (If Applicable).   Holter Monitoring A Holter monitor is a small device that is used to detect abnormal heart rhythms. It clips to your clothing and is connected by wires to flat, sticky disks (electrodes) that attach to your chest. It is worn continuously for 24-48 hours. Follow these instructions at home:  Wear your Holter monitor at all times, even while exercising and sleeping, for as long as directed by your health care provider.  Make sure that the Holter monitor is safely clipped to your clothing or close to your body as recommended by your health care provider.  Do not get the monitor or wires wet.  Do not put body lotion or moisturizer on your chest.  Keep your skin clean.  Keep a diary of your daily activities, such as walking and doing chores. If you feel that your heartbeat is abnormal or that your heart is fluttering or skipping a  beat:  Record what you are doing when it happens.  Record what time of day the symptoms occur.  Return your Holter monitor as directed by your health care provider.  Keep all follow-up visits as directed by your health care provider. This is important. Get help right away if:  You feel lightheaded or you faint.  You have trouble breathing.  You feel pain in your chest, upper arm, or jaw.  You feel sick to your stomach and your skin is pale, cool, or damp.  You heartbeat feels unusual or abnormal. This information is not intended to replace advice given to you by your health care provider. Make sure you discuss any questions you have with your health care provider. Document Released: 11/16/2003 Document Revised: 07/26/2015 Document Reviewed: 09/26/2013 Elsevier Interactive Patient Education  2017 [employer].

## 2016-05-14 ENCOUNTER — Ambulatory Visit (INDEPENDENT_AMBULATORY_CARE_PROVIDER_SITE_OTHER): Payer: BLUE CROSS/BLUE SHIELD

## 2016-05-14 DIAGNOSIS — R002 Palpitations: Secondary | ICD-10-CM

## 2016-06-10 ENCOUNTER — Encounter (INDEPENDENT_AMBULATORY_CARE_PROVIDER_SITE_OTHER): Payer: Self-pay

## 2016-06-10 ENCOUNTER — Ambulatory Visit (INDEPENDENT_AMBULATORY_CARE_PROVIDER_SITE_OTHER): Payer: BLUE CROSS/BLUE SHIELD | Admitting: Cardiology

## 2016-06-10 ENCOUNTER — Encounter: Payer: Self-pay | Admitting: Cardiology

## 2016-06-10 VITALS — BP 108/82 | HR 54 | Ht 73.0 in | Wt 181.6 lb

## 2016-06-10 DIAGNOSIS — R002 Palpitations: Secondary | ICD-10-CM

## 2016-06-10 NOTE — Patient Instructions (Signed)
Medication Instructions:    Your physician recommends that you continue on your current medications as directed. Please refer to the Current Medication list given to you today.  --- If you need a refill on your cardiac medications before your next appointment, please call your pharmacy. ---  Labwork:  None ordered  Testing/Procedures:  None ordered  Follow-Up:  No follow up is needed at this time with Dr. Curt Bears.  He will see you on an as needed basis.  Thank you for choosing [facility]!!   Trinidad Curet, RN [phone]        r

## 2016-06-10 NOTE — Progress Notes (Signed)
Electrophysiology Office Note   Date:  06/10/2016   ID:  Brittany Waters, DOB 05-Apr-1970, MRN [id number]  PCP:  Brittany Man, MD  Primary Electrophysiologist:  Brittany Haw, MD    Chief Complaint  Patient presents with  . Follow-up    Palpitations     History of Present Illness: Brittany Waters is a 46 y.o. female who presents today for electrophysiology evaluation.   She presents today for evaluation of palpitations. Is also associated with shortness of breath. Her palpitations are been going on since February. She wore a Holter monitor that showed rare PVCs and sinus rhythm. She does continue to have episodic palpitations. There is no relation to caffeine.  Today, she denies symptoms of chest pain, shortness of breath, orthopnea, PND, lower extremity edema, claudication, dizziness, presyncope, syncope, bleeding, or neurologic sequela. The patient is tolerating medications without difficulties and is otherwise without complaint today.    Past Medical History:  Diagnosis Date  . Dysplastic nevus    of left arm  . Spontaneous pneumothorax    stapled on the left upper lobe   Past Surgical History:  Procedure Laterality Date  . NO PAST SURGERIES       No current outpatient prescriptions on file.   No current facility-administered medications for this visit.     Allergies:   Penicillins; Sulfonamide derivatives; and Sulfur   Social History:  The patient  reports that she has never smoked. She has never used smokeless tobacco. She reports that she does not drink alcohol or use drugs.   Family History:  The patient's family history includes Atrial fibrillation in her father, maternal grandmother, and mother; Colon cancer in her maternal grandfather; Heart attack in her paternal grandmother; Hypertension in her father; Stroke in her maternal grandmother.    ROS:  Please see the history of present illness.   Otherwise, review of systems is positive for palpitations.    All other systems are reviewed and negative.    PHYSICAL EXAM: VS:  BP 108/82   Pulse (!) 54   Ht 6' 1"  (1.854 m)   Wt 181 lb 9.6 oz (82.4 kg)   BMI 23.96 kg/m  , BMI Body mass index is 23.96 kg/m. GEN: Well nourished, well developed, in no acute distress  HEENT: normal  Neck: no JVD, carotid bruits, or masses Cardiac: RRR; no murmurs, rubs, or gallops,no edema  Respiratory:  clear to auscultation bilaterally, normal work of breathing GI: soft, nontender, nondistended, + BS MS: no deformity or atrophy  Skin: warm and dry Neuro:  Strength and sensation are intact Psych: euthymic mood, full affect  EKG:  EKG is not ordered today. Personal review of the ekg ordered 05/06/16 shows sinus rhythm, rate 66, PVC  Recent Labs: 11/23/2015: BUN 16; Creatinine, Ser 0.88; Hemoglobin 13.6; Platelets 170.0; Potassium 4.3; Sodium 140; TSH 0.43    Lipid Panel     Component Value Date/Time   CHOL 140 01/13/2008 1017   TRIG 36 01/13/2008 1017   HDL 47.6 01/13/2008 1017   CHOLHDL 2.9 CALC 01/13/2008 1017   VLDL 7 01/13/2008 1017   LDLCALC 85 01/13/2008 1017     Wt Readings from Last 3 Encounters:  06/10/16 181 lb 9.6 oz (82.4 kg)  05/06/16 178 lb (80.7 kg)  11/23/15 174 lb 2 oz (79 kg)      Other studies Reviewed: Additional studies/ records that were reviewed today include: Holter monitor 05/14/16 Minimum HR: 45 BPM at 6:07:44 PM(2) Maximum HR:  164 BPM at 5:28:22 AM(2) Average HR: 71 BPM Sinus rhythm with sinus tachycardia Zero atrial fibrillation 0.4% PVCs, rare APCs   ASSESSMENT AND PLAN:  1.  Palpitations: Wore monitor showing sinus rhythm with sinus tachycardia. Her palpitations seen on her monitor.  Told her that these palpitations did not put her at risk of major abnormality. I have offered her medication management versus watchful waiting. She would like to continue to monitor her palpitations and call us back if they fail to improve or get worse.    Current  medicines are reviewed at length with the patient today.   The patient does not have concerns regarding her medicines.  The following changes were made today:  none  Labs/ tests ordered today include:  No orders of the defined types were placed in this encounter.    Disposition:   FU with Brittany Waters PRN month  Signed, Brittany Ortez Meredith Leeds, MD  06/10/2016 9:50 AM     [facility] [address] [place] [place] [address] [phone] (office) [phone] (fax)

## 2016-09-02 ENCOUNTER — Ambulatory Visit (INDEPENDENT_AMBULATORY_CARE_PROVIDER_SITE_OTHER): Payer: BLUE CROSS/BLUE SHIELD | Admitting: Family Medicine

## 2016-09-02 VITALS — BP 102/78 | HR 59 | Temp 98.0°F | Wt 177.2 lb

## 2016-09-02 DIAGNOSIS — L237 Allergic contact dermatitis due to plants, except food: Secondary | ICD-10-CM | POA: Diagnosis not present

## 2016-09-02 MED ORDER — PREDNISONE 10 MG PO TABS: ORAL_TABLET | ORAL | 0 refills | Status: DC

## 2016-09-02 NOTE — Patient Instructions (Signed)

## 2016-09-02 NOTE — Progress Notes (Signed)
Subjective:     Patient ID: Brittany Waters, female   DOB: 04/12/1970, 46 y.o.   MRN: [id number]  HPI Patient seen as a work in with pruritic vesicular rash on her left neck and forearms. Onset this past weekend. Progressed through the week. Worse with heat. She tried some type of topical and also some oral Benadryl without much improvement. Moderate pruritus.  Past Medical History:  Diagnosis Date  . Dysplastic nevus    of left arm  . Spontaneous pneumothorax    stapled on the left upper lobe   Past Surgical History:  Procedure Laterality Date  . NO PAST SURGERIES      reports that she has never smoked. She has never used smokeless tobacco. She reports that she does not drink alcohol or use drugs. family history includes Atrial fibrillation in her father, maternal grandmother, and mother; Colon cancer in her maternal grandfather; Heart attack in her paternal grandmother; Hypertension in her father; Stroke in her maternal grandmother. Allergies  Allergen Reactions  . Penicillins Hives  . Sulfonamide Derivatives   . Sulfur Hives  '   Review of Systems  Constitutional: Negative for chills and fever.  Skin: Positive for rash.       Objective:   Physical Exam  Constitutional: She appears well-developed and well-nourished.  Cardiovascular: Normal rate and regular rhythm.   Pulmonary/Chest: Effort normal and breath sounds normal. No respiratory distress. She has no wheezes. She has no rales.  Skin: Rash noted.  Patient has vesicular rash scattered on her forearms bilaterally and left side of neck. A couple areas are in linear distribution.       Assessment:     Contact dermatitis    Plan:     -Prednisone taper starting at 60 mg daily -Avoid heat which may exacerbate symptoms  Eulas Post MD [facility]

## 2016-12-25 DIAGNOSIS — Z1231 Encounter for screening mammogram for malignant neoplasm of breast: Secondary | ICD-10-CM | POA: Diagnosis not present

## 2016-12-31 DIAGNOSIS — Z01419 Encounter for gynecological examination (general) (routine) without abnormal findings: Secondary | ICD-10-CM | POA: Diagnosis not present

## 2016-12-31 DIAGNOSIS — N841 Polyp of cervix uteri: Secondary | ICD-10-CM | POA: Diagnosis not present

## 2016-12-31 DIAGNOSIS — Z6823 Body mass index (BMI) 23.0-23.9, adult: Secondary | ICD-10-CM | POA: Diagnosis not present

## 2017-01-05 DIAGNOSIS — L821 Other seborrheic keratosis: Secondary | ICD-10-CM | POA: Diagnosis not present

## 2017-01-05 DIAGNOSIS — D1801 Hemangioma of skin and subcutaneous tissue: Secondary | ICD-10-CM | POA: Diagnosis not present

## 2017-01-05 DIAGNOSIS — L814 Other melanin hyperpigmentation: Secondary | ICD-10-CM | POA: Diagnosis not present

## 2017-01-09 DIAGNOSIS — Z13220 Encounter for screening for lipoid disorders: Secondary | ICD-10-CM | POA: Diagnosis not present

## 2017-01-09 DIAGNOSIS — Z13228 Encounter for screening for other metabolic disorders: Secondary | ICD-10-CM | POA: Diagnosis not present

## 2017-02-13 ENCOUNTER — Encounter: Payer: Self-pay | Admitting: Adult Health

## 2017-02-13 ENCOUNTER — Encounter: Payer: Self-pay | Admitting: Family Medicine

## 2017-02-13 ENCOUNTER — Ambulatory Visit: Payer: BLUE CROSS/BLUE SHIELD | Admitting: Adult Health

## 2017-02-13 VITALS — BP 96/50 | Temp 98.0°F | Wt 178.0 lb

## 2017-02-13 DIAGNOSIS — R1013 Epigastric pain: Secondary | ICD-10-CM | POA: Diagnosis not present

## 2017-02-13 LAB — CBC WITH DIFFERENTIAL/PLATELET
BASOS ABS: 0 10*3/uL (ref 0.0–0.1)
Basophils Relative: 0.6 % (ref 0.0–3.0)
EOS ABS: 0.1 10*3/uL (ref 0.0–0.7)
Eosinophils Relative: 1.5 % (ref 0.0–5.0)
HEMATOCRIT: 40.9 % (ref 36.0–46.0)
HEMOGLOBIN: 13.5 g/dL (ref 12.0–15.0)
LYMPHS PCT: 28.9 % (ref 12.0–46.0)
Lymphs Abs: 1.3 10*3/uL (ref 0.7–4.0)
MCHC: 33 g/dL (ref 30.0–36.0)
MCV: 92.3 fl (ref 78.0–100.0)
Monocytes Absolute: 0.4 10*3/uL (ref 0.1–1.0)
Monocytes Relative: 8.4 % (ref 3.0–12.0)
Neutro Abs: 2.8 10*3/uL (ref 1.4–7.7)
Neutrophils Relative %: 60.6 % (ref 43.0–77.0)
Platelets: 187 10*3/uL (ref 150.0–400.0)
RBC: 4.43 Mil/uL (ref 3.87–5.11)
RDW: 13 % (ref 11.5–15.5)
WBC: 4.6 10*3/uL (ref 4.0–10.5)

## 2017-02-13 LAB — COMPREHENSIVE METABOLIC PANEL
ALBUMIN: 4.3 g/dL (ref 3.5–5.2)
ALT: 12 U/L (ref 0–35)
AST: 13 U/L (ref 0–37)
Alkaline Phosphatase: 45 U/L (ref 39–117)
BILIRUBIN TOTAL: 1.1 mg/dL (ref 0.2–1.2)
BUN: 15 mg/dL (ref 6–23)
CALCIUM: 9.2 mg/dL (ref 8.4–10.5)
CO2: 30 meq/L (ref 19–32)
CREATININE: 0.84 mg/dL (ref 0.40–1.20)
Chloride: 104 mEq/L (ref 96–112)
GFR: 78.18 mL/min (ref >60.00)
Glucose, Bld: 81 mg/dL (ref 70–99)
Potassium: 4 mEq/L (ref 3.5–5.1)
Sodium: 139 mEq/L (ref 135–145)
Total Protein: 6.6 g/dL (ref 6.0–8.3)

## 2017-02-13 NOTE — Progress Notes (Signed)
Subjective:    Patient ID: Brittany Waters, female    DOB: 1970-10-13, 46 y.o.   MRN: [id number]  Abdominal Pain  This is a new problem. The current episode started 1 to 4 weeks ago (2 weeks). The onset quality is gradual. The problem occurs constantly. The problem has been unchanged. The pain is located in the epigastric region and RUQ. The pain is at a severity of 3/10. The quality of the pain is aching. The abdominal pain does not radiate. Associated symptoms include anorexia, belching and nausea. Pertinent negatives include no constipation, diarrhea, dysuria, fever, flatus, headaches, hematuria, melena, vomiting or weight loss. The pain is aggravated by certain positions and eating. The pain is relieved by nothing. She has tried nothing for the symptoms. There is no history of abdominal surgery, gallstones, GERD, irritable bowel syndrome or PUD.      Review of Systems  Constitutional: Positive for appetite change. Negative for diaphoresis, fatigue, fever and weight loss.  HENT: Negative.   Respiratory: Positive for cough (dry ).   Cardiovascular: Negative.   Gastrointestinal: Positive for abdominal pain, anorexia and nausea. Negative for constipation, diarrhea, flatus, melena and vomiting.  Genitourinary: Negative.  Negative for dysuria and hematuria.  Skin: Negative.   Neurological: Negative for headaches.   Past Medical History:  Diagnosis Date  . Dysplastic nevus    of left arm  . Spontaneous pneumothorax    stapled on the left upper lobe    Social History   Socioeconomic History  . Marital status: Married    Spouse name: Not on file  . Number of children: Not on file  . Years of education: Not on file  . Highest education level: Not on file  Social Needs  . Financial resource strain: Not on file  . Food insecurity - worry: Not on file  . Food insecurity - inability: Not on file  . Transportation needs - medical: Not on file  . Transportation needs - non-medical: Not  on file  Occupational History  . Not on file  Tobacco Use  . Smoking status: Never Smoker  . Smokeless tobacco: Never Used  Substance and Sexual Activity  . Alcohol use: No  . Drug use: No  . Sexual activity: Not on file  Other Topics Concern  . Not on file  Social History Narrative  . Not on file    Past Surgical History:  Procedure Laterality Date  . NO PAST SURGERIES      Family History  Problem Relation Age of Onset  . Alcohol abuse Unknown        fhx  . Hyperlipidemia Unknown        fhx  . Hypertension Unknown        fhx  . Atrial fibrillation Mother   . Atrial fibrillation Father   . Hypertension Father   . Atrial fibrillation Maternal Grandmother   . Stroke Maternal Grandmother   . Colon cancer Maternal Grandfather   . Heart attack Paternal Grandmother     Allergies  Allergen Reactions  . Penicillins Hives  . Sulfonamide Derivatives   . Sulfur Hives    No current outpatient medications on file prior to visit.   No current facility-administered medications on file prior to visit.     BP (!) 96/50   Temp 98 F (36.7 C) (Oral)   Wt 178 lb (80.7 kg)   BMI 23.48 kg/m       Objective:   Physical Exam  Constitutional: She  is oriented to person, place, and time. She appears well-developed and well-nourished. No distress.  Cardiovascular: Normal rate, regular rhythm and intact distal pulses. Exam reveals no gallop and no friction rub.  No murmur heard. Pulmonary/Chest: Effort normal and breath sounds normal. No respiratory distress. She has no wheezes. She has no rales. She exhibits no tenderness.  Abdominal: Soft. Normal appearance and bowel sounds are normal. She exhibits no distension and no mass. There is no hepatosplenomegaly, splenomegaly or hepatomegaly. There is tenderness (mild tenderness to epigastric area and RUQ ) in the right upper quadrant and epigastric area. There is no rebound, no guarding and no CVA tenderness. No hernia.    Neurological: She is alert and oriented to person, place, and time.  Skin: Skin is warm and dry. No rash noted. She is not diaphoretic. No erythema. No pallor.  Psychiatric: She has a normal mood and affect. Her behavior is normal. Judgment and thought content normal.  Vitals reviewed.     Assessment & Plan:  1. Epigastric pain - Exam consistent with GERD like symptoms. No concern for bowel obstruction, UTI, or kidney stone. Will get labs to r/o other causes - CBC with Differential/Platelet - CMP - Advised OTC prilosec 20 mg daily for 2-3 weeks and then can stop taking it.  - Follow up in one week if symptoms not resolving or sooner if symptoms become worse - Consider CT scan in the future  Dorothyann Peng, NP

## 2017-02-13 NOTE — Patient Instructions (Addendum)
It was great seeing you again.   Your exam is consistent with acid reflux.   Please take omeprazole 20 mg daily for 2-3 weeks and then stop  If you are not feeling any better in a week, please follow up with me    Heartburn Heartburn is a type of pain or discomfort that can happen in the throat or chest. It is often described as a burning pain. It may also cause a bad taste in the mouth. Heartburn may feel worse when you lie down or bend over. It may be caused by stomach contents that move back up (reflux) into the tube that connects the mouth with the stomach (esophagus). Follow these instructions at home: Take these actions to lessen your discomfort and to help avoid problems. Diet  Follow a diet as told by your doctor. You may need to avoid foods and drinks such as: ? Coffee and tea (with or without caffeine). ? Drinks that contain alcohol. ? Energy drinks and sports drinks. ? Carbonated drinks or sodas. ? Chocolate and cocoa. ? Peppermint and mint flavorings. ? Garlic and onions. ? Horseradish. ? Spicy and acidic foods, such as peppers, chili powder, curry powder, vinegar, hot sauces, and BBQ sauce. ? Citrus fruit juices and citrus fruits, such as oranges, lemons, and limes. ? Tomato-based foods, such as red sauce, chili, salsa, and pizza with red sauce. ? Fried and fatty foods, such as donuts, french fries, potato chips, and high-fat dressings. ? High-fat meats, such as hot dogs, rib eye steak, sausage, ham, and bacon. ? High-fat dairy items, such as whole milk, butter, and cream cheese.  Eat small meals often. Avoid eating large meals.  Avoid drinking large amounts of liquid with your meals.  Avoid eating meals during the 2-3 hours before bedtime.  Avoid lying down right after you eat.  Do not exercise right after you eat. General instructions  Pay attention to any changes in your symptoms.  Take over-the-counter and prescription medicines only as told by your  doctor. Do not take aspirin, ibuprofen, or other NSAIDs unless your doctor says it is okay.  Do not use any tobacco products, including cigarettes, chewing tobacco, and e-cigarettes. If you need help quitting, ask your doctor.  Wear loose clothes. Do not wear anything tight around your waist.  Raise (elevate) the head of your bed about 6 inches (15 cm).  Try to lower your stress. If you need help doing this, ask your doctor.  If you are overweight, lose an amount of weight that is healthy for you. Ask your doctor about a safe weight loss goal.  Keep all follow-up visits as told by your doctor. This is important. Contact a doctor if:  You have new symptoms.  You lose weight and you do not know why it is happening.  You have trouble swallowing, or it hurts to swallow.  You have wheezing or a cough that keeps happening.  Your symptoms do not get better with treatment.  You have heartburn often for more than two weeks. Get help right away if:  You have pain in your arms, neck, jaw, teeth, or back.  You feel sweaty, dizzy, or light-headed.  You have chest pain or shortness of breath.  You throw up (vomit) and your throw up looks like blood or coffee grounds.  Your poop (stool) is bloody or black. This information is not intended to replace advice given to you by your health care provider. Make sure you discuss any questions  you have with your health care provider. Document Released: 10/30/2010 Document Revised: 07/26/2015 Document Reviewed: 06/14/2014 Elsevier Interactive Patient Education  [employer].

## 2017-05-03 DIAGNOSIS — S2020XA Contusion of thorax, unspecified, initial encounter: Secondary | ICD-10-CM | POA: Diagnosis not present

## 2017-07-06 DIAGNOSIS — J0100 Acute maxillary sinusitis, unspecified: Secondary | ICD-10-CM | POA: Diagnosis not present

## 2017-07-13 ENCOUNTER — Ambulatory Visit: Payer: Self-pay | Admitting: *Deleted

## 2017-07-13 ENCOUNTER — Ambulatory Visit: Payer: BLUE CROSS/BLUE SHIELD | Admitting: Family Medicine

## 2017-07-13 ENCOUNTER — Encounter: Payer: Self-pay | Admitting: Family Medicine

## 2017-07-13 VITALS — BP 102/70 | HR 59 | Temp 97.8°F | Wt 182.9 lb

## 2017-07-13 DIAGNOSIS — H6503 Acute serous otitis media, bilateral: Secondary | ICD-10-CM

## 2017-07-13 DIAGNOSIS — J019 Acute sinusitis, unspecified: Secondary | ICD-10-CM

## 2017-07-13 NOTE — Patient Instructions (Signed)

## 2017-07-13 NOTE — Progress Notes (Signed)
  Subjective:     Patient ID: Brittany Waters, female   DOB: 1971/01/29, 47 y.o.   MRN: [id number]  HPI Patient seen with almost 3 week history of sinus congestion and sinus pressure pain. She had increased progressive symptoms involving her frontal and maxillary sinuses. She was seen in urgent care a week ago and placed on doxycycline. She is on day 6 and does feel somewhat better overall. Her major concern is that she's had some ear fullness bilaterally and was scheduled to do a business trip to [place] tomorrow for presentation. She is worried about increased risk to her eardrums with fluid in the ears. She's not had any vertigo. No fever. Overall improved since starting doxycycline  Past Medical History:  Diagnosis Date  . Dysplastic nevus    of left arm  . Spontaneous pneumothorax    stapled on the left upper lobe   Past Surgical History:  Procedure Laterality Date  . NO PAST SURGERIES      reports that she has never smoked. She has never used smokeless tobacco. She reports that she does not drink alcohol or use drugs. family history includes Alcohol abuse in her unknown relative; Atrial fibrillation in her father, maternal grandmother, and mother; Colon cancer in her maternal grandfather; Heart attack in her paternal grandmother; Hyperlipidemia in her unknown relative; Hypertension in her father and unknown relative; Stroke in her maternal grandmother. Allergies  Allergen Reactions  . Penicillins Hives  . Sulfonamide Derivatives   . Sulfur Hives     Review of Systems  Constitutional: Negative for chills and fever.  HENT: Positive for congestion and ear pain. Negative for ear discharge.   Respiratory: Negative for cough and shortness of breath.   Neurological: Negative for dizziness and headaches.       Objective:   Physical Exam  Constitutional: She appears well-developed and well-nourished.  HENT:  Right Ear: External ear normal.  Left Ear: External ear normal.   Mouth/Throat: Oropharynx is clear and moist.  She has evidence for some retraction involving the right eardrum and she has some mild erythema involving the left eardrum with serous effusion bilaterally. Question early supperative changes inferior aspect left eardrum. No perforation  Cardiovascular: Normal rate and regular rhythm.  Pulmonary/Chest: Effort normal and breath sounds normal. She has no wheezes. She has no rales.       Assessment:     Bilateral serous otitis media following recent acute sinusitis. Her sinusitis symptoms are slowly improving on doxycycline    Plan:     -Finish out doxycycline -We explained serous effusions can take sometimes several weeks to resolve. -We also explained that she does have some increased risk of barotrauma with flying and she will try to get out of her trip tomorrow if possible  Eulas Post MD [facility] at [facility]

## 2017-07-13 NOTE — Telephone Encounter (Signed)
Pt called with asking for medical advice regarding taking a flight having nasal congestion. Pt states that she went to the [facility] and was given an antibiotic for her sinuses. But she waited to take the medication. She also stated that they noticed that one of her ears was red.  She is stating now that she has awaken with a headache in the back of her head every day since last Wed or Thurs when she went to the [facility]. She has fullness in both ears and facial pressure.  Denies having a fever now. She is just afraid to fly with fullness that she is experiencing now Appointment made for today per protocol. Will route to flow at [facility] [place].  Reason for Disposition . [1] Taking antibiotic > 72 hours (3 days) AND [2] sinus pain not improved  Answer Assessment - Initial Assessment Questions 1. LOCATION: "Where does it hurt?"      Head and face 2. ONSET: "When did the sinus pain start?"  (e.g., hours, days)      pressure in face 3. SEVERITY: "How bad is the pain?"   (Scale 1-10; mild, moderate or severe)   - MILD (1-3): doesn't interfere with normal activities    - MODERATE (4-7): interferes with normal activities (e.g., work or school) or awakens from sleep   - SEVERE (8-10): excruciating pain and patient unable to do any normal activities       Pain #4 4. RECURRENT SYMPTOM: "Have you ever had sinus problems before?" If so, ask: "When was the last time?" and "What happened that time?"      no 5. NASAL CONGESTION: "Is the nose blocked?" If so, ask, "Can you open it or must you breathe through the mouth?"     yes 6. NASAL DISCHARGE: "Do you have discharge from your nose?" If so ask, "What color?"     Yes, green 7. FEVER: "Do you have a fever?" If so, ask: "What is it, how was it measured, and when did it start?"      no 8. OTHER SYMPTOMS: "Do you have any other symptoms?" (e.g., sore throat, cough, earache, difficulty breathing)     Ear fullness at times 9.  PREGNANCY: "Is there any chance you are pregnant?" "When was your last menstrual period?"     no  Protocols used: SINUS INFECTION ON ANTIBIOTIC FOLLOW-UP CALL-A-AH, SINUS PAIN OR CONGESTION-A-AH

## 2017-10-09 DIAGNOSIS — M9905 Segmental and somatic dysfunction of pelvic region: Secondary | ICD-10-CM | POA: Diagnosis not present

## 2017-10-09 DIAGNOSIS — M9902 Segmental and somatic dysfunction of thoracic region: Secondary | ICD-10-CM | POA: Diagnosis not present

## 2017-10-09 DIAGNOSIS — M9903 Segmental and somatic dysfunction of lumbar region: Secondary | ICD-10-CM | POA: Diagnosis not present

## 2017-10-09 DIAGNOSIS — M62830 Muscle spasm of back: Secondary | ICD-10-CM | POA: Diagnosis not present

## 2018-03-08 DIAGNOSIS — Z6822 Body mass index (BMI) 22.0-22.9, adult: Secondary | ICD-10-CM | POA: Diagnosis not present

## 2018-03-08 DIAGNOSIS — Z01419 Encounter for gynecological examination (general) (routine) without abnormal findings: Secondary | ICD-10-CM | POA: Diagnosis not present

## 2018-03-08 DIAGNOSIS — D225 Melanocytic nevi of trunk: Secondary | ICD-10-CM | POA: Diagnosis not present

## 2018-03-08 DIAGNOSIS — D1801 Hemangioma of skin and subcutaneous tissue: Secondary | ICD-10-CM | POA: Diagnosis not present

## 2018-03-08 DIAGNOSIS — Z1231 Encounter for screening mammogram for malignant neoplasm of breast: Secondary | ICD-10-CM | POA: Diagnosis not present

## 2018-03-08 DIAGNOSIS — L821 Other seborrheic keratosis: Secondary | ICD-10-CM | POA: Diagnosis not present

## 2018-08-19 ENCOUNTER — Ambulatory Visit: Payer: Self-pay | Admitting: *Deleted

## 2018-08-19 NOTE — Telephone Encounter (Signed)
My in-laws are elderly with health problems.   If I go to see them I must either isolate for 14 days after getting to [place]  or have a test saying I'm negative within 72 hrs prior to arrival.   This is a requirement in [place].I don't have any risk factors or symptoms so it's hard for me to get tested because I don't meet the criteria.  I'm leaving for [place] on July 9th so I'm trying to get this done prior leaving.  I sent these notes to the office.   Reason for Disposition . [1] Caller requesting NON-URGENT health information AND [2] PCP's office is the best resource  Answer Assessment - Initial Assessment Questions 1. REASON FOR CALL or QUESTION: "What is your reason for calling today?" or "How can I best help you?" or "What question do you have that I can help answer?"     I'm going to [place] and I'm required to be tested for COVID-19.  Protocols used: INFORMATION ONLY CALL-A-AH

## 2018-08-19 NOTE — Telephone Encounter (Signed)
Please offer virtual visit with any available provider. Pt will also need TOC appt.

## 2018-08-25 ENCOUNTER — Telehealth: Payer: Self-pay | Admitting: General Practice

## 2018-08-25 ENCOUNTER — Ambulatory Visit (INDEPENDENT_AMBULATORY_CARE_PROVIDER_SITE_OTHER): Payer: BC Managed Care – PPO | Admitting: Internal Medicine

## 2018-08-25 ENCOUNTER — Other Ambulatory Visit: Payer: Self-pay

## 2018-08-25 DIAGNOSIS — Z20822 Contact with and (suspected) exposure to covid-19: Secondary | ICD-10-CM

## 2018-08-25 DIAGNOSIS — Z20828 Contact with and (suspected) exposure to other viral communicable diseases: Secondary | ICD-10-CM

## 2018-08-25 NOTE — Telephone Encounter (Signed)
Patient called and advised of the request for covid testing. She says she will need to be tested on 09/07/18. Appointment scheduled on 09/07/18 at 0800 at [facility], advised of location and to wear a mask for everyone in the vehicle, she verbalized understanding. She says her husband has a paper from his doctor to have covid testing. I advised to have him call his doctor's office and ask them to call [phone] to refer him for testing and a nurse will call him to scheduled, because the testing site will not take a handwritten order, she verbalized understanding.

## 2018-08-25 NOTE — Progress Notes (Signed)
Virtual Visit via Video Note  I connected with Brittany Waters on 08/25/18 at 11:00 AM EDT by a video enabled telemedicine application and verified that I am speaking with the correct person using two identifiers.  Location patient: home Location provider: work office Persons participating in the virtual visit: patient, provider  I discussed the limitations of evaluation and management by telemedicine and the availability of in person appointments. The patient expressed understanding and agreed to proceed.   HPI: This visit has been scheduled to establish care and also to discuss possible COVID 19 testing.  Brittany Waters is blessed with good health.  She is very physically active.  She really has no past medical history of significance other than a spontaneous pneumothorax x2 in her 60s.  She works in [employer] fund[employer], she has 1 son and 2 stepchildren.  She is planning on going to [place] at the beginning of July to visit her in-laws who are sick.  She tells me that in order to rent property in [place] they require a negative COVID test within 72 hours and this is the reason why she is scheduling this visit today.  She has no known exposures, she has no URI symptoms and feels in good health.   ROS: Constitutional: Denies fever, chills, diaphoresis, appetite change and fatigue.  HEENT: Denies photophobia, eye pain, redness, hearing loss, ear pain, congestion, sore throat, rhinorrhea, sneezing, mouth sores, trouble swallowing, neck pain, neck stiffness and tinnitus.   Respiratory: Denies SOB, DOE, cough, chest tightness,  and wheezing.   Cardiovascular: Denies chest pain, palpitations and leg swelling.  Gastrointestinal: Denies nausea, vomiting, abdominal pain, diarrhea, constipation, blood in stool and abdominal distention.  Genitourinary: Denies dysuria, urgency, frequency, hematuria, flank pain and difficulty urinating.  Endocrine: Denies: hot or cold intolerance, sweats, changes in hair or nails,  polyuria, polydipsia. Musculoskeletal: Denies myalgias, back pain, joint swelling, arthralgias and gait problem.  Skin: Denies pallor, rash and wound.  Neurological: Denies dizziness, seizures, syncope, weakness, light-headedness, numbness and headaches.  Hematological: Denies adenopathy. Easy bruising, personal or family bleeding history  Psychiatric/Behavioral: Denies suicidal ideation, mood changes, confusion, nervousness, sleep disturbance and agitation   Past Medical History:  Diagnosis Date  . Dysplastic nevus    of left arm  . Spontaneous pneumothorax    stapled on the left upper lobe    Past Surgical History:  Procedure Laterality Date  . NO PAST SURGERIES      Family History  Problem Relation Age of Onset  . Alcohol abuse Unknown        fhx  . Hyperlipidemia Unknown        fhx  . Hypertension Unknown        fhx  . Atrial fibrillation Mother   . Atrial fibrillation Father   . Hypertension Father   . Atrial fibrillation Maternal Grandmother   . Stroke Maternal Grandmother   . Colon cancer Maternal Grandfather   . Heart attack Paternal Grandmother     SOCIAL HX:   reports that she has never smoked. She has never used smokeless tobacco. She reports that she does not drink alcohol or use drugs.  No current outpatient medications on file.  EXAM:   VITALS per patient if applicable: None reported  GENERAL: alert, oriented, appears well and in no acute distress  HEENT: atraumatic, conjunttiva clear, no obvious abnormalities on inspection of external nose and ears  NECK: normal movements of the head and neck  LUNGS: on inspection no signs of  respiratory distress, breathing rate appears normal, no obvious gross increased work of breathing, gasping or wheezing  CV: no obvious cyanosis  MS: moves all visible extremities without noticeable abnormality  PSYCH/NEURO: pleasant and cooperative, no obvious depression or anxiety, speech and thought processing grossly  intact  ASSESSMENT AND PLAN:   Need for COVID-19 testing -She is asymptomatic has no known exposures, however to visit the state of [place] she tells me she is required to show proof within 72 hours prior to entering the state that she is negative. -Will arrange for COVID-19 testing.  She will arrange follow-up at some point for CPE.     I discussed the assessment and treatment plan with the patient. The patient was provided an opportunity to ask questions and all were answered. The patient agreed with the plan and demonstrated an understanding of the instructions.   The patient was advised to call back or seek an in-person evaluation if the symptoms worsen or if the condition fails to improve as anticipated.    Lelon Frohlich, MD  [facility]

## 2018-08-25 NOTE — Telephone Encounter (Signed)
lvm for pt at [facility] to return call to schedule covid testing.

## 2018-08-25 NOTE — Addendum Note (Signed)
Addended by: Matilde Sprang on: 08/25/2018 01:53 PM   Modules accepted: Orders

## 2018-09-07 ENCOUNTER — Other Ambulatory Visit: Payer: Self-pay

## 2018-09-07 DIAGNOSIS — Z20822 Contact with and (suspected) exposure to covid-19: Secondary | ICD-10-CM

## 2018-09-07 DIAGNOSIS — R6889 Other general symptoms and signs: Secondary | ICD-10-CM | POA: Diagnosis not present

## 2018-09-11 LAB — NOVEL CORONAVIRUS, NAA: SARS-CoV-2, NAA: NOT DETECTED

## 2018-11-25 ENCOUNTER — Emergency Department (HOSPITAL_COMMUNITY)
Admission: EM | Admit: 2018-11-25 | Discharge: 2018-11-25 | Disposition: A | Discharge status: Home / Self Care | Payer: BC Managed Care – PPO | Attending: Emergency Medicine | Admitting: Emergency Medicine

## 2018-11-25 ENCOUNTER — Ambulatory Visit: Payer: Self-pay | Admitting: *Deleted

## 2018-11-25 ENCOUNTER — Encounter (HOSPITAL_COMMUNITY): Payer: Self-pay | Admitting: Emergency Medicine

## 2018-11-25 DIAGNOSIS — Y999 Unspecified external cause status: Secondary | ICD-10-CM | POA: Diagnosis not present

## 2018-11-25 DIAGNOSIS — R110 Nausea: Secondary | ICD-10-CM | POA: Diagnosis not present

## 2018-11-25 DIAGNOSIS — R51 Headache: Secondary | ICD-10-CM | POA: Diagnosis not present

## 2018-11-25 DIAGNOSIS — Y939 Activity, unspecified: Secondary | ICD-10-CM | POA: Insufficient documentation

## 2018-11-25 DIAGNOSIS — S060X0A Concussion without loss of consciousness, initial encounter: Secondary | ICD-10-CM

## 2018-11-25 DIAGNOSIS — Y929 Unspecified place or not applicable: Secondary | ICD-10-CM | POA: Insufficient documentation

## 2018-11-25 NOTE — Telephone Encounter (Signed)
Pt has arrived to [facility] ED for evaluation.
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
## 2019-01-06 DIAGNOSIS — Z20828 Contact with and (suspected) exposure to other viral communicable diseases: Secondary | ICD-10-CM | POA: Diagnosis not present

## 2019-01-19 ENCOUNTER — Other Ambulatory Visit: Payer: Self-pay

## 2019-01-20 ENCOUNTER — Ambulatory Visit: Payer: BC Managed Care – PPO

## 2019-01-20 ENCOUNTER — Ambulatory Visit (INDEPENDENT_AMBULATORY_CARE_PROVIDER_SITE_OTHER): Payer: BC Managed Care – PPO | Admitting: *Deleted

## 2019-01-20 DIAGNOSIS — Z23 Encounter for immunization: Secondary | ICD-10-CM

## 2019-01-31 ENCOUNTER — Telehealth (INDEPENDENT_AMBULATORY_CARE_PROVIDER_SITE_OTHER): Payer: BC Managed Care – PPO | Admitting: Family Medicine

## 2019-01-31 DIAGNOSIS — J069 Acute upper respiratory infection, unspecified: Secondary | ICD-10-CM | POA: Diagnosis not present

## 2019-01-31 DIAGNOSIS — Z20828 Contact with and (suspected) exposure to other viral communicable diseases: Secondary | ICD-10-CM | POA: Diagnosis not present

## 2019-01-31 DIAGNOSIS — Z7189 Other specified counseling: Secondary | ICD-10-CM

## 2019-01-31 DIAGNOSIS — R0982 Postnasal drip: Secondary | ICD-10-CM

## 2019-01-31 NOTE — Progress Notes (Signed)
Virtual Visit via Video Note  I connected with Brittany Waters on 01/31/19 at  1:30 PM EST by a video enabled telemedicine application 2/2 [id number] pandemic and verified that I am speaking with the correct person using two identifiers.  Location patient: home Location provider:work or home office Persons participating in the virtual visit: patient, provider  I discussed the limitations of evaluation and management by telemedicine and the availability of in person appointments. The patient expressed understanding and agreed to proceed.   HPI: Pt with general unwell feeling, coughing, eyes hurting, and feeling achy x 3 days.  Pt also endorses nasal congestion, subjective low grade temp.  Had an itchy sensation in throat, post nasal drainage.  Denies sore throat, ear pain or pressure, diarrhea, loss of taste or smell, or sick contacts.  Pt took ibuprofen for her symptoms.   Pt denies h/o allergies.   ROS: See pertinent positives and negatives per HPI.  Past Medical History:  Diagnosis Date  . Dysplastic nevus    of left arm  . Spontaneous pneumothorax    stapled on the left upper lobe    Past Surgical History:  Procedure Laterality Date  . NO PAST SURGERIES      Family History  Problem Relation Age of Onset  . Alcohol abuse Other        fhx  . Hyperlipidemia Other        fhx  . Hypertension Other        fhx  . Atrial fibrillation Mother   . Atrial fibrillation Father   . Hypertension Father   . Atrial fibrillation Maternal Grandmother   . Stroke Maternal Grandmother   . Colon cancer Maternal Grandfather   . Heart attack Paternal Grandmother     No current outpatient medications on file.  EXAM:  VITALS per patient if applicable: RR between 30-94 bpm  GENERAL: alert, oriented, appears well and in no acute distress  HEENT: atraumatic, conjunctiva clear, no obvious abnormalities on inspection of external nose and ears  NECK: normal movements of the head and neck  LUNGS:  on inspection no signs of respiratory distress, breathing rate appears normal, no obvious gross SOB, gasping or wheezing  CV: no obvious cyanosis  MS: moves all visible extremities without noticeable abnormality  PSYCH/NEURO: pleasant and cooperative, no obvious depression or anxiety, speech and thought processing grossly intact  ASSESSMENT AND PLAN:  Discussed the following assessment and plan:  Viral URI with cough -cannot r/o COVID infection -supportive care: Tylenol, OTC cold meds, gargling with warm salt water or chloraseptic spray -given precautions  Post-nasal drainage -possibly 2/2 allergies -discussed starting OTC allergy medication or flonase  Educated about COVID-19 virus infection -discussed s/s of COVID-19 infection -given info on community testing sites -consider testing for continued or worsening symptoms  F/u prn  I discussed the assessment and treatment plan with the patient. The patient was provided an opportunity to ask questions and all were answered. The patient agreed with the plan and demonstrated an understanding of the instructions.   The patient was advised to call back or seek an in-person evaluation if the symptoms worsen or if the condition fails to improve as anticipated  Billie Ruddy, MD

## 2019-03-08 DIAGNOSIS — L905 Scar conditions and fibrosis of skin: Secondary | ICD-10-CM | POA: Diagnosis not present

## 2019-03-08 DIAGNOSIS — Z872 Personal history of diseases of the skin and subcutaneous tissue: Secondary | ICD-10-CM | POA: Diagnosis not present

## 2019-03-08 DIAGNOSIS — D225 Melanocytic nevi of trunk: Secondary | ICD-10-CM | POA: Diagnosis not present

## 2019-03-08 DIAGNOSIS — L820 Inflamed seborrheic keratosis: Secondary | ICD-10-CM | POA: Diagnosis not present

## 2019-03-08 DIAGNOSIS — D1801 Hemangioma of skin and subcutaneous tissue: Secondary | ICD-10-CM | POA: Diagnosis not present

## 2019-03-08 DIAGNOSIS — C44319 Basal cell carcinoma of skin of other parts of face: Secondary | ICD-10-CM | POA: Diagnosis not present

## 2019-03-08 DIAGNOSIS — D224 Melanocytic nevi of scalp and neck: Secondary | ICD-10-CM | POA: Diagnosis not present

## 2019-03-08 DIAGNOSIS — D485 Neoplasm of uncertain behavior of skin: Secondary | ICD-10-CM | POA: Diagnosis not present

## 2019-03-23 DIAGNOSIS — H353131 Nonexudative age-related macular degeneration, bilateral, early dry stage: Secondary | ICD-10-CM | POA: Diagnosis not present

## 2019-04-14 DIAGNOSIS — D485 Neoplasm of uncertain behavior of skin: Secondary | ICD-10-CM | POA: Diagnosis not present

## 2019-06-13 DIAGNOSIS — J019 Acute sinusitis, unspecified: Secondary | ICD-10-CM | POA: Diagnosis not present

## 2019-08-12 ENCOUNTER — Ambulatory Visit (HOSPITAL_COMMUNITY): Payer: Self-pay

## 2019-08-17 ENCOUNTER — Ambulatory Visit
Admission: RE | Admit: 2019-08-17 | Discharge: 2019-08-17 | Disposition: A | Discharge status: Home / Self Care | Payer: BC Managed Care – PPO | Source: Ambulatory Visit | Attending: Sports Medicine | Admitting: Sports Medicine

## 2019-08-17 ENCOUNTER — Other Ambulatory Visit: Payer: Self-pay

## 2019-08-17 ENCOUNTER — Encounter: Payer: Self-pay | Admitting: Sports Medicine

## 2019-08-17 ENCOUNTER — Ambulatory Visit: Payer: BC Managed Care – PPO | Admitting: Sports Medicine

## 2019-08-17 VITALS — BP 114/74 | Ht 73.0 in | Wt 170.0 lb

## 2019-08-17 DIAGNOSIS — S99911A Unspecified injury of right ankle, initial encounter: Secondary | ICD-10-CM | POA: Diagnosis not present

## 2019-08-17 DIAGNOSIS — M25571 Pain in right ankle and joints of right foot: Secondary | ICD-10-CM

## 2019-08-17 NOTE — Progress Notes (Addendum)
   [facility] [address] [place],  [address] Phone: [phone] Fax: [phone]   Patient Name: Brittany Waters Date of Birth: [birth year] Medical Record Number: [id number] Gender: female Date of Encounter: 08/17/2019  SUBJECTIVE:      Chief Complaint:  Right ankle pain   DOI: 08/11/19  HPI:  Brittany Waters is a 49 year old F presenting with 6 days of acute right ankle pain.  She was walking at the gym last Thursday and inverted her right ankle.  She had pain and swelling.  She has been walking on the ankle and regular tennis shoes.  She used ibuprofen the first couple of days which helped.  She has a history of multiple sprains to this ankle when she was younger.  She denies any numbness or tingling into her toes.  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     ROS:     See HPI.   PERTINENT  PMH / PSH / FH / SH:  Past Medical, Surgical, Social, and Family History Reviewed & Updated in the EMR. Pertinent findings include:  Naproxen allergy, migraines   OBJECTIVE:  BP 114/74   Ht 6' 1"  (1.854 m)   Wt 170 lb (77.1 kg)   BMI 22.43 kg/m  Physical Exam:  Vital signs are reviewed.   GEN: Alert and oriented, NAD Pulm: Breathing unlabored PSY: normal mood, congruent affect  MSK: Right ankle Moderate swelling of medial ankle and ecchymosis on medial and lateral ankle Pain with active ROM that improves passively TTP at medial and lateral malleoli and over anterior mortise No TTP at base of fifth metatarsal Positive ant drawer and talar tilt.   Negative syndesmotic compression. Thompsons test negative. NV intact distally.   ASSESSMENT & PLAN:   1. Right ankle pain  Given that she has been walking on the ankle for 6 days, I will obtain XR of the her ankle to rule out a fracture.  She was fitted in a walking boot and has crutches at home.  She will start  home range of motion exercises.  She can use ibuprofen or Tylenol for pain relief.  I will call her with the results from the x-ray today, assuming there is no fracture, I will plan to see her back in 2 weeks.  I did explain to her that because she was walking on this ankle for almost 1 week after the injury that it will take longer for her to heal.   Lanier Clam, DO, ATC Sports Medicine Fellow  Addendum:  I was the preceptor for this visit and available for immediate consultation.  Karlton Lemon MD Brittany Waters

## 2019-08-31 ENCOUNTER — Ambulatory Visit: Payer: BC Managed Care – PPO | Admitting: Family Medicine

## 2019-08-31 ENCOUNTER — Other Ambulatory Visit: Payer: Self-pay

## 2019-08-31 ENCOUNTER — Encounter: Payer: Self-pay | Admitting: Family Medicine

## 2019-08-31 DIAGNOSIS — M25571 Pain in right ankle and joints of right foot: Secondary | ICD-10-CM | POA: Diagnosis not present

## 2019-08-31 NOTE — Progress Notes (Signed)
PCP: Isaac Bliss, Rayford Halsted, MD  Subjective:   HPI: Patient is a 49 y.o. female here for right ankle injury.  6/16: Brittany Waters is a 49 year old F presenting with 6 days of acute right ankle pain.  She was walking at the gym last Thursday and inverted her right ankle.  She had pain and swelling.  She has been walking on the ankle and regular tennis shoes.  She used ibuprofen the first couple of days which helped.  She has a history of multiple sprains to this ankle when she was younger.  She denies any numbness or tingling into her toes.  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  6/30: Patient reports she is doing much better compared to last visit. Stopped wearing boot - felt like achilles was getting tight with this. Ankle does not feel as unstable as she was worried it would be. Able to walk with a little discomfort anterolateral right ankle. Still with swelling.  Past Medical History:  Diagnosis Date  . Dysplastic nevus    of left arm  . Spontaneous pneumothorax    stapled on the left upper lobe    No current outpatient medications on file prior to visit.   No current facility-administered medications on file prior to visit.    Past Surgical History:  Procedure Laterality Date  . NO PAST SURGERIES      Allergies  Allergen Reactions  . Naproxen Hives  . Penicillins Hives  . Sulfonamide Derivatives   . Sulfur Hives    Social History   Socioeconomic History  . Marital status: Married    Spouse name: Not on file  . Number of children: Not on file  . Years of education: Not on file  . Highest education level: Not on file  Occupational History  . Not on file  Tobacco Use  . Smoking status: Never Smoker  . Smokeless tobacco: Never Used  Substance and Sexual Activity  . Alcohol use: No  . Drug use: No  . Sexual activity: Not on file  Other Topics Concern  . Not on file   Social History Narrative  . Not on file   Social Determinants of Health   Financial Resource Strain:   . Difficulty of Paying Living Expenses:   Food Insecurity:   . Worried About Charity fundraiser in the Last Year:   . Arboriculturist in the Last Year:   Transportation Needs:   . Film/video editor (Medical):   Marland Kitchen Lack of Transportation (Non-Medical):   Physical Activity:   . Days of Exercise per Week:   . Minutes of Exercise per Session:   Stress:   . Feeling of Stress :   Social Connections:   . Frequency of Communication with Friends and Family:   . Frequency of Social Gatherings with Friends and Family:   . Attends Religious Services:   . Active Member of Clubs or Organizations:   . Attends Archivist Meetings:   Marland Kitchen Marital Status:   Intimate Partner Violence:   . Fear of Current or Ex-Partner:   . Emotionally Abused:   Marland Kitchen Physically Abused:   . Sexually Abused:     Family History  Problem Relation Age of Onset  . Alcohol abuse Other        fhx  . Hyperlipidemia Other        fhx  .  Hypertension Other        fhx  . Atrial fibrillation Mother   . Atrial fibrillation Father   . Hypertension Father   . Atrial fibrillation Maternal Grandmother   . Stroke Maternal Grandmother   . Colon cancer Maternal Grandfather   . Heart attack Paternal Grandmother     BP 98/70   Ht 6' 1"  (1.854 m)   Wt 175 lb (79.4 kg)   LMP 08/03/2019   BMI 23.09 kg/m   Review of Systems: See HPI above.     Objective:  Physical Exam:  Gen: NAD, comfortable in exam room  Right ankle: Mild anterolateral swelling.  No bruising, other deformity. Mild limitation ROM all directions.  5/5 strength. TTP over ATFL.  No post malleolar, base 5th, navicular tenderness. 1+ ant drawer and negative talar tilt.   Negative syndesmotic compression. Thompsons test negative. NV intact distally.   MSK u/s right ankle:  Disruption of ATFL.  No ankle effusion.  Old bony fragment  noted distal to lateral malleolus.  No lateral talar dome abnormalities.  Assessment & Plan:  1. Right ankle injury - 2/2 severe lateral ankle sprain.  Start home exercise program.  Icing, ASO when outdoors and exercising.  She's had hives with naproxen so couldn't take aleve.  Tylenol if needed.  Consider physical therapy in future.  F/u in 4 weeks.

## 2019-08-31 NOTE — Patient Instructions (Signed)
Ice the area for 15 minutes at a time, 3-4 times a day if needed. Aleve 2 tabs twice a day with food OR ibuprofen 3 tabs three times a day with food for pain and inflammation if needed. Elevate above the level of your heart when possible Use laceup brace when up and walking around for next 4 weeks (primarily when exercising and outside the house) to help with stability while you recover from this injury. Start theraband strengthening exercises - once a day 3 sets of 10. Wait about a week before adding the balance exercises. Consider physical therapy if not improving. Ok to walk, cycle, swim for exercise but avoid any cutting, irregular ground, side to side motions for now. Follow up in 4 weeks.

## 2019-09-15 DIAGNOSIS — Z01419 Encounter for gynecological examination (general) (routine) without abnormal findings: Secondary | ICD-10-CM | POA: Diagnosis not present

## 2019-09-15 DIAGNOSIS — N841 Polyp of cervix uteri: Secondary | ICD-10-CM | POA: Diagnosis not present

## 2019-09-15 DIAGNOSIS — Z6824 Body mass index (BMI) 24.0-24.9, adult: Secondary | ICD-10-CM | POA: Diagnosis not present

## 2019-09-15 DIAGNOSIS — N840 Polyp of corpus uteri: Secondary | ICD-10-CM | POA: Diagnosis not present

## 2019-09-15 DIAGNOSIS — Z1231 Encounter for screening mammogram for malignant neoplasm of breast: Secondary | ICD-10-CM | POA: Diagnosis not present

## 2019-09-28 ENCOUNTER — Ambulatory Visit: Payer: BC Managed Care – PPO | Admitting: Family Medicine

## 2019-09-28 ENCOUNTER — Encounter: Payer: Self-pay | Admitting: Family Medicine

## 2019-09-28 ENCOUNTER — Other Ambulatory Visit: Payer: Self-pay

## 2019-09-28 VITALS — BP 120/74 | Ht 73.0 in | Wt 180.0 lb

## 2019-09-28 DIAGNOSIS — M25571 Pain in right ankle and joints of right foot: Secondary | ICD-10-CM | POA: Diagnosis not present

## 2019-09-28 NOTE — Progress Notes (Signed)
PCP: Isaac Bliss, Rayford Halsted, MD  Subjective:   HPI: Patient is a 49 y.o. female here for right ankle injury.  6/16: Brittany Waters is a 49 year old F presenting with 6 days of acute right ankle pain.  She was walking at the gym last Thursday and inverted her right ankle.  She had pain and swelling.  She has been walking on the ankle and regular tennis shoes.  She used ibuprofen the first couple of days which helped.  She has a history of multiple sprains to this ankle when she was younger.  She denies any numbness or tingling into her toes.  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  6/30: Patient reports she is doing much better compared to last visit. Stopped wearing boot - felt like achilles was getting tight with this. Ankle does not feel as unstable as she was worried it would be. Able to walk with a little discomfort anterolateral right ankle. Still with swelling.  7/28: Patient reports she continues to improve. She still gets swelling especially at the end of the day. Pain level is less in the morning and does get better as the day goes on. She feels some instability but does feel more steady on one foot than she used to. She is doing home exercises. She still wears the brace when exercising.  Past Medical History:  Diagnosis Date  . Dysplastic nevus    of left arm  . Spontaneous pneumothorax    stapled on the left upper lobe    No current outpatient medications on file prior to visit.   No current facility-administered medications on file prior to visit.    Past Surgical History:  Procedure Laterality Date  . NO PAST SURGERIES      Allergies  Allergen Reactions  . Naproxen Hives  . Penicillins Hives  . Sulfonamide Derivatives   . Sulfur Hives    Social History   Socioeconomic History  . Marital status: Married    Spouse name: Not on file  . Number of children: Not on file   . Years of education: Not on file  . Highest education level: Not on file  Occupational History  . Not on file  Tobacco Use  . Smoking status: Never Smoker  . Smokeless tobacco: Never Used  Substance and Sexual Activity  . Alcohol use: No  . Drug use: No  . Sexual activity: Not on file  Other Topics Concern  . Not on file  Social History Narrative  . Not on file   Social Determinants of Health   Financial Resource Strain:   . Difficulty of Paying Living Expenses:   Food Insecurity:   . Worried About Charity fundraiser in the Last Year:   . Arboriculturist in the Last Year:   Transportation Needs:   . Film/video editor (Medical):   Marland Kitchen Lack of Transportation (Non-Medical):   Physical Activity:   . Days of Exercise per Week:   . Minutes of Exercise per Session:   Stress:   . Feeling of Stress :   Social Connections:   . Frequency of Communication with Friends and Family:   . Frequency of Social Gatherings with Friends and Family:   . Attends Religious Services:   . Active Member of Clubs or Organizations:   . Attends Archivist Meetings:   Marland Kitchen Marital Status:   Intimate  Partner Violence:   . Fear of Current or Ex-Partner:   . Emotionally Abused:   Marland Kitchen Physically Abused:   . Sexually Abused:     Family History  Problem Relation Age of Onset  . Alcohol abuse Other        fhx  . Hyperlipidemia Other        fhx  . Hypertension Other        fhx  . Atrial fibrillation Mother   . Atrial fibrillation Father   . Hypertension Father   . Atrial fibrillation Maternal Grandmother   . Stroke Maternal Grandmother   . Colon cancer Maternal Grandfather   . Heart attack Paternal Grandmother     BP 120/74   Ht 6' 1"  (1.854 m)   Wt 180 lb (81.6 kg)   BMI 23.75 kg/m   Review of Systems: See HPI above.     Objective:  Physical Exam:  Gen: NAD, comfortable in exam room  Right ankle: Mild anterolateral swelling.  No other gross deformity,  ecchymoses FROM with 5/5 strength all directions TTP minimally over ATFL.  No other tenderness. 1+ ant drawer and negative talar tilt.   Negative syndesmotic compression. Thompsons test negative. NV intact distally.  Assessment & Plan:  1. Right ankle injury -secondary to severe lateral ankle sprain.  She is improving.  Continue with home exercise program.  Icing.  Tylenol only if needed.  ASO with exercise and with regular surfaces for the next 4 weeks.  Follow-up as needed.

## 2019-09-28 NOTE — Patient Instructions (Signed)
Continue with the home exercises until pain/stiffness has resolved. Continue with the ankle brace as you have been for another 4 weeks. Call me if you have any issues otherwise follow up as needed.  The padding we were talking about is a metatarsal pad. If this gets bad enough that you want [place] to place some in a tennis/running shoe, give [place] a call.

## 2020-02-09 DIAGNOSIS — H5713 Ocular pain, bilateral: Secondary | ICD-10-CM | POA: Diagnosis not present

## 2020-07-07 IMAGING — DX DG ANKLE COMPLETE 3+V*R*
3 series · 3 of 3 positions shown · non-contrast
Comparison: None.

CLINICAL DATA: Ankle injury several days ago with persistent ankle
pain, initial encounter

EXAM:
RIGHT ANKLE - COMPLETE 3+ VIEW

[dg ankle complete right (1 of 3)]
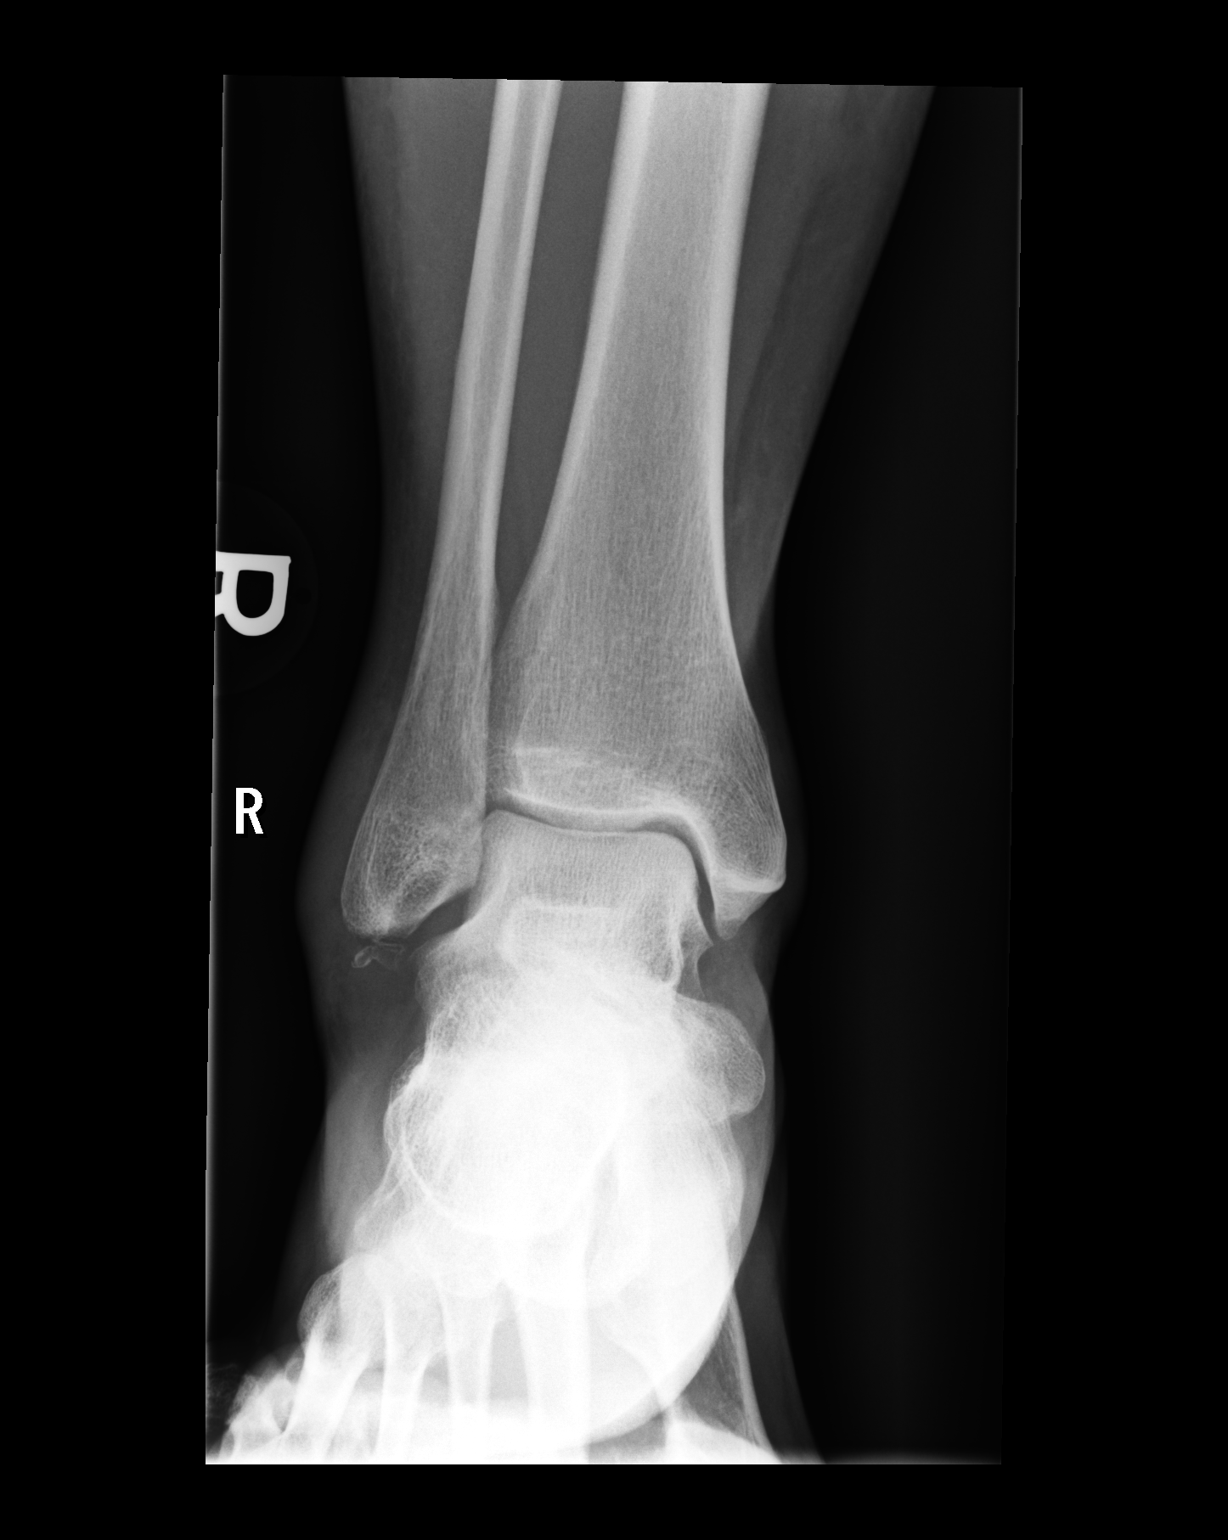

[dg ankle complete right (2 of 3)]
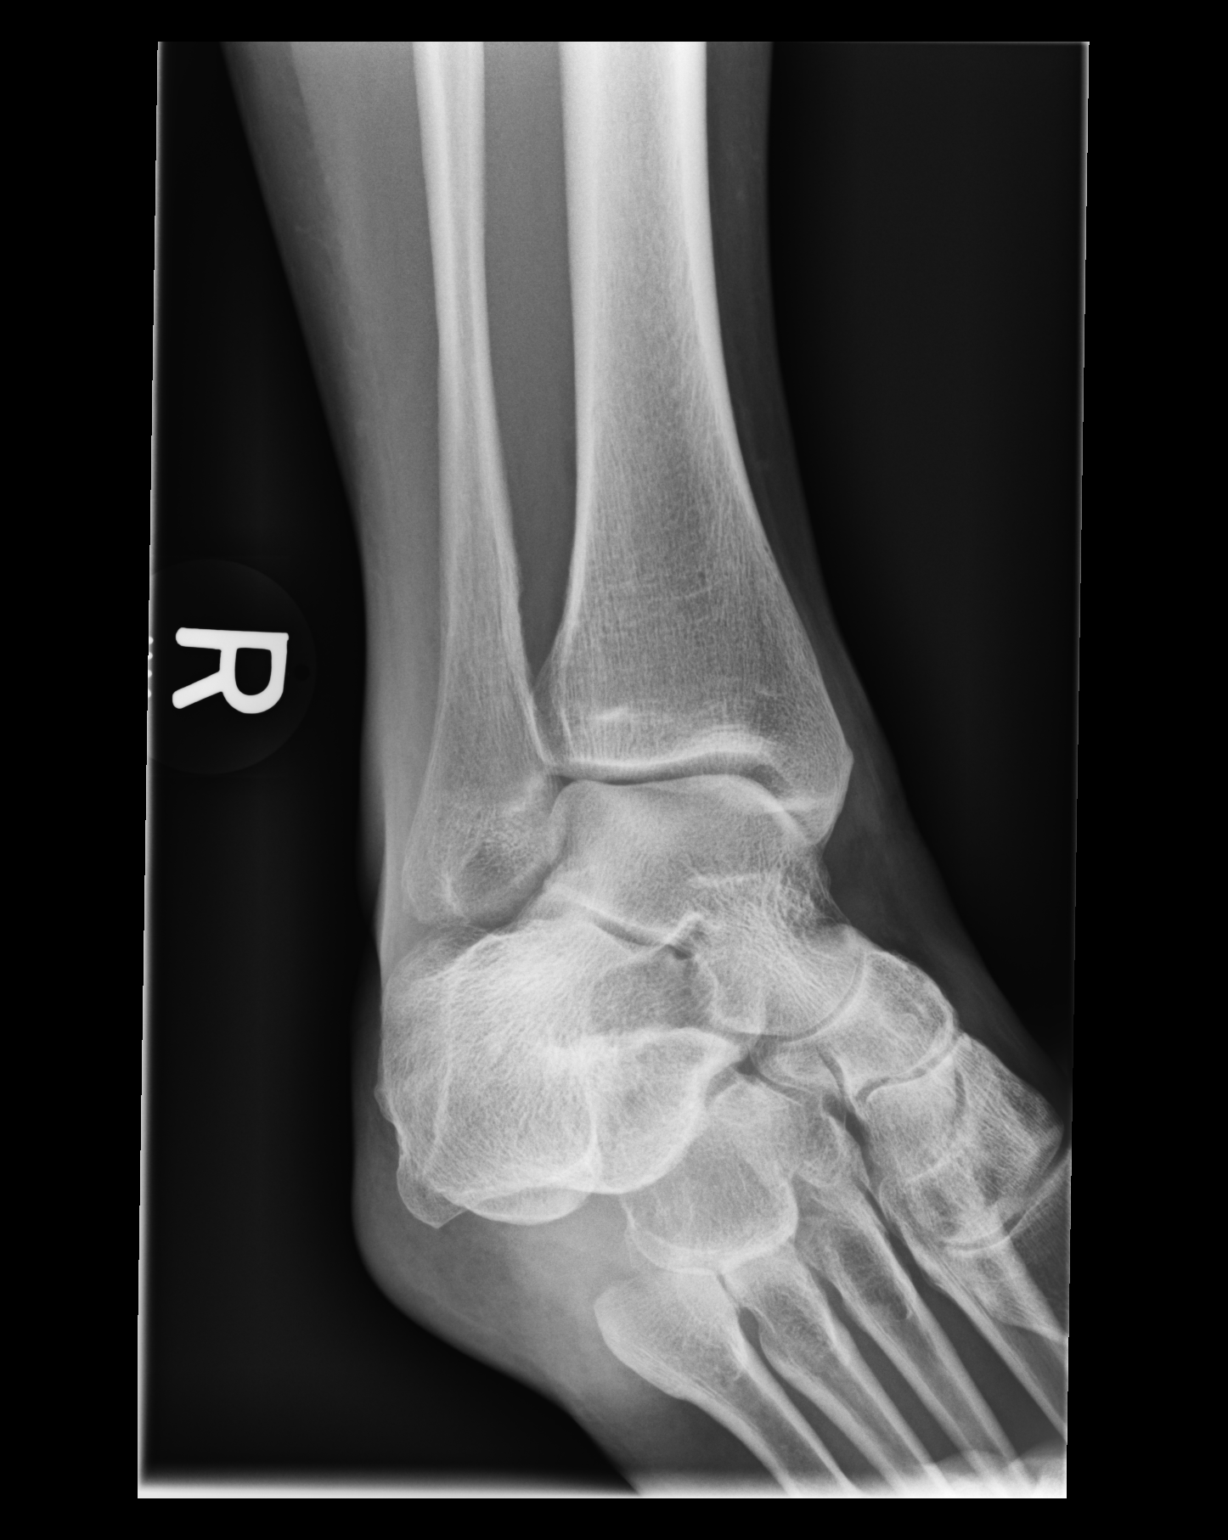

[dg ankle complete right (3 of 3)]
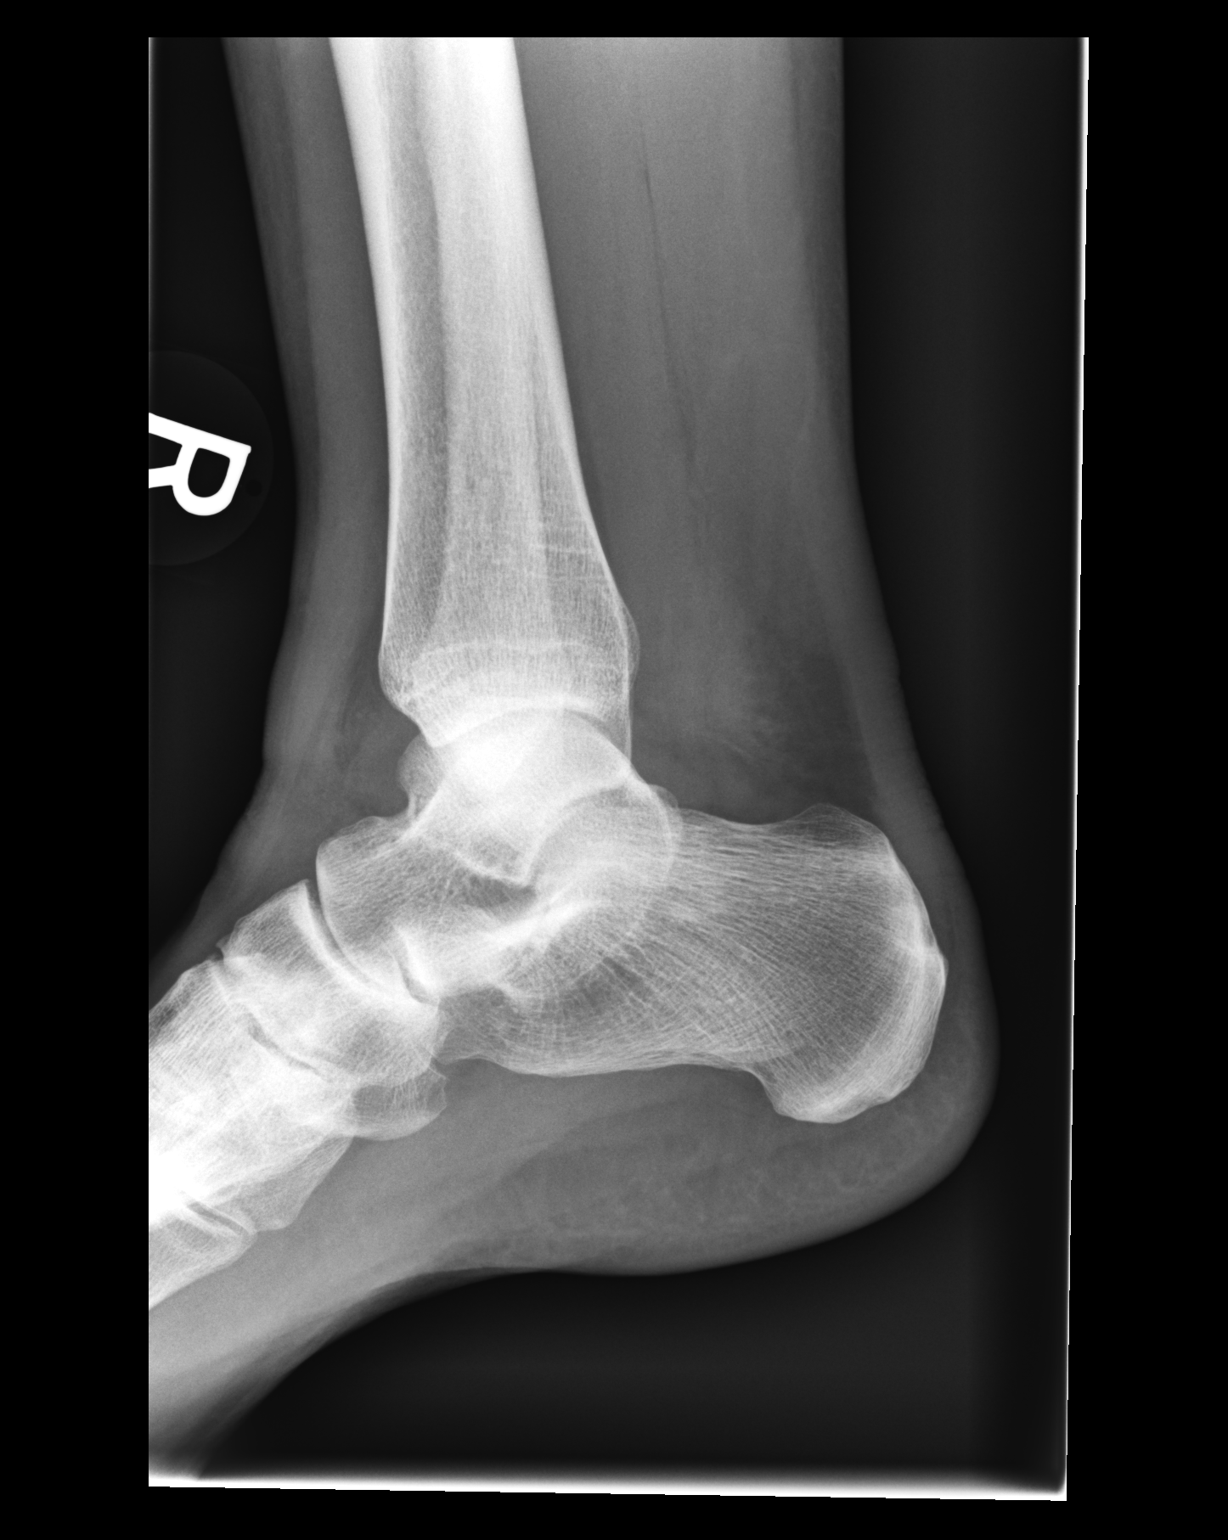

[3 of 3 positions shown; findings below may reference images not displayed]

FINDINGS: Soft tissue swelling is noted laterally. Bony densities are noted
adjacent to the tip of the fibula likely related to avulsion
fracture. No other fractures are seen.
IMPRESSION: Bony density is noted adjacent to the tip of the fibula consistent
with avulsion fracture.

## 2020-07-10 DIAGNOSIS — Z8709 Personal history of other diseases of the respiratory system: Secondary | ICD-10-CM | POA: Diagnosis not present

## 2020-07-10 DIAGNOSIS — Z6823 Body mass index (BMI) 23.0-23.9, adult: Secondary | ICD-10-CM | POA: Diagnosis not present

## 2020-07-10 DIAGNOSIS — Z0000 Encounter for general adult medical examination without abnormal findings: Secondary | ICD-10-CM | POA: Diagnosis not present

## 2020-10-01 DIAGNOSIS — Z20822 Contact with and (suspected) exposure to covid-19: Secondary | ICD-10-CM | POA: Diagnosis not present

## 2020-12-06 DIAGNOSIS — Z6823 Body mass index (BMI) 23.0-23.9, adult: Secondary | ICD-10-CM | POA: Diagnosis not present

## 2020-12-06 DIAGNOSIS — Z1231 Encounter for screening mammogram for malignant neoplasm of breast: Secondary | ICD-10-CM | POA: Diagnosis not present

## 2020-12-06 DIAGNOSIS — Z01419 Encounter for gynecological examination (general) (routine) without abnormal findings: Secondary | ICD-10-CM | POA: Diagnosis not present

## 2021-02-09 DIAGNOSIS — Z1211 Encounter for screening for malignant neoplasm of colon: Secondary | ICD-10-CM | POA: Diagnosis not present

## 2021-02-15 LAB — COLOGUARD: COLOGUARD: NEGATIVE

## 2023-12-15 ENCOUNTER — Other Ambulatory Visit: Payer: Self-pay | Admitting: Obstetrics and Gynecology

## 2023-12-15 DIAGNOSIS — Z1231 Encounter for screening mammogram for malignant neoplasm of breast: Secondary | ICD-10-CM

## 2023-12-23 ENCOUNTER — Ambulatory Visit
Admission: RE | Admit: 2023-12-23 | Discharge: 2023-12-23 | Disposition: A | Source: Ambulatory Visit | Attending: Obstetrics and Gynecology | Admitting: Obstetrics and Gynecology

## 2023-12-23 DIAGNOSIS — Z1231 Encounter for screening mammogram for malignant neoplasm of breast: Secondary | ICD-10-CM
